# Patient Record
Sex: Female | Born: 1940 | Race: Black or African American | Hispanic: No | Smoking: Never smoker
Health system: Southern US, Community
[De-identification: ages and names within clinical notes are randomized; demographics above are authoritative.]

## PROBLEM LIST (undated history)

## (undated) DIAGNOSIS — I10 Essential (primary) hypertension: Secondary | ICD-10-CM

## (undated) DIAGNOSIS — J302 Other seasonal allergic rhinitis: Secondary | ICD-10-CM

---

## 2015-07-05 ENCOUNTER — Emergency Department (HOSPITAL_COMMUNITY): Payer: Medicare Other

## 2015-07-05 ENCOUNTER — Encounter (HOSPITAL_COMMUNITY): Payer: Self-pay | Admitting: Emergency Medicine

## 2015-07-05 ENCOUNTER — Observation Stay (HOSPITAL_COMMUNITY)
Admission: EM | Admit: 2015-07-05 | Discharge: 2015-07-06 | Disposition: A | Discharge status: Home / Self Care | Payer: Medicare Other | Attending: Internal Medicine | Admitting: Internal Medicine

## 2015-07-05 ENCOUNTER — Observation Stay (HOSPITAL_COMMUNITY): Payer: Medicare Other

## 2015-07-05 DIAGNOSIS — I4510 Unspecified right bundle-branch block: Secondary | ICD-10-CM | POA: Insufficient documentation

## 2015-07-05 DIAGNOSIS — E871 Hypo-osmolality and hyponatremia: Secondary | ICD-10-CM | POA: Insufficient documentation

## 2015-07-05 DIAGNOSIS — I10 Essential (primary) hypertension: Secondary | ICD-10-CM | POA: Diagnosis not present

## 2015-07-05 DIAGNOSIS — R4781 Slurred speech: Secondary | ICD-10-CM | POA: Insufficient documentation

## 2015-07-05 DIAGNOSIS — E861 Hypovolemia: Secondary | ICD-10-CM | POA: Diagnosis not present

## 2015-07-05 DIAGNOSIS — K5900 Constipation, unspecified: Secondary | ICD-10-CM | POA: Insufficient documentation

## 2015-07-05 DIAGNOSIS — E876 Hypokalemia: Secondary | ICD-10-CM | POA: Insufficient documentation

## 2015-07-05 DIAGNOSIS — R55 Syncope and collapse: Principal | ICD-10-CM | POA: Diagnosis present

## 2015-07-05 DIAGNOSIS — E669 Obesity, unspecified: Secondary | ICD-10-CM | POA: Diagnosis not present

## 2015-07-05 DIAGNOSIS — R479 Unspecified speech disturbances: Secondary | ICD-10-CM

## 2015-07-05 DIAGNOSIS — M1990 Unspecified osteoarthritis, unspecified site: Secondary | ICD-10-CM | POA: Diagnosis not present

## 2015-07-05 DIAGNOSIS — Z6841 Body Mass Index (BMI) 40.0 and over, adult: Secondary | ICD-10-CM | POA: Diagnosis not present

## 2015-07-05 HISTORY — DX: Essential (primary) hypertension: I10

## 2015-07-05 HISTORY — DX: Other seasonal allergic rhinitis: J30.2

## 2015-07-05 LAB — CBC
HEMATOCRIT: 43.4 % (ref 36.0–46.0)
HEMOGLOBIN: 14.7 g/dL (ref 12.0–15.0)
MCH: 28.1 pg (ref 26.0–34.0)
MCHC: 33.9 g/dL (ref 30.0–36.0)
MCV: 83 fL (ref 78.0–100.0)
Platelets: 257 10*3/uL (ref 150–400)
RBC: 5.23 MIL/uL — ABNORMAL HIGH (ref 3.87–5.11)
RDW: 13.8 % (ref 11.5–15.5)
WBC: 6.9 10*3/uL (ref 4.0–10.5)

## 2015-07-05 LAB — BASIC METABOLIC PANEL
ANION GAP: 13 (ref 5–15)
Anion gap: 9 (ref 5–15)
BUN: 11 mg/dL (ref 6–20)
BUN: 9 mg/dL (ref 6–20)
CALCIUM: 8.7 mg/dL — AB (ref 8.9–10.3)
CHLORIDE: 94 mmol/L — AB (ref 101–111)
CHLORIDE: 97 mmol/L — AB (ref 101–111)
CO2: 20 mmol/L — AB (ref 22–32)
CO2: 25 mmol/L (ref 22–32)
CREATININE: 0.77 mg/dL (ref 0.44–1.00)
Calcium: 9.1 mg/dL (ref 8.9–10.3)
Creatinine, Ser: 0.92 mg/dL (ref 0.44–1.00)
GFR calc Af Amer: >60 mL/min (ref >60)
GFR calc Af Amer: >60 mL/min (ref >60)
GFR calc non Af Amer: >60 mL/min (ref >60)
GFR, EST NON AFRICAN AMERICAN: 59 mL/min — AB (ref >60)
GLUCOSE: 101 mg/dL — AB (ref 65–99)
GLUCOSE: 106 mg/dL — AB (ref 65–99)
POTASSIUM: 3.8 mmol/L (ref 3.5–5.1)
Potassium: 3 mmol/L — ABNORMAL LOW (ref 3.5–5.1)
Sodium: 127 mmol/L — ABNORMAL LOW (ref 135–145)
Sodium: 131 mmol/L — ABNORMAL LOW (ref 135–145)

## 2015-07-05 LAB — URINALYSIS, ROUTINE W REFLEX MICROSCOPIC
Bilirubin Urine: NEGATIVE
GLUCOSE, UA: NEGATIVE mg/dL
HGB URINE DIPSTICK: NEGATIVE
Ketones, ur: 15 mg/dL — AB
LEUKOCYTES UA: NEGATIVE
Nitrite: NEGATIVE
PH: 7 (ref 5.0–8.0)
PROTEIN: NEGATIVE mg/dL
SPECIFIC GRAVITY, URINE: 1.007 (ref 1.005–1.030)

## 2015-07-05 LAB — HEPATIC FUNCTION PANEL
ALK PHOS: 55 U/L (ref 38–126)
ALT: 23 U/L (ref 14–54)
AST: 13 U/L — AB (ref 15–41)
Albumin: 3.3 g/dL — ABNORMAL LOW (ref 3.5–5.0)
BILIRUBIN DIRECT: 0.1 mg/dL (ref 0.1–0.5)
Indirect Bilirubin: 0.5 mg/dL (ref 0.3–0.9)
Total Bilirubin: 0.6 mg/dL (ref 0.3–1.2)
Total Protein: 7.1 g/dL (ref 6.5–8.1)

## 2015-07-05 LAB — I-STAT TROPONIN, ED: Troponin i, poc: 0 ng/mL (ref 0.00–0.08)

## 2015-07-05 LAB — TROPONIN I: Troponin I: <0.03 ng/mL (ref <0.03)

## 2015-07-05 MED ORDER — ENOXAPARIN SODIUM 40 MG/0.4ML ~~LOC~~ SOLN
40.0000 mg | SUBCUTANEOUS | Status: DC
  Administered 2015-07-05: 40 mg via SUBCUTANEOUS
  Filled 2015-07-05: qty 0.4

## 2015-07-05 MED ORDER — SODIUM CHLORIDE 0.9% FLUSH
3.0000 mL | Freq: Two times a day (BID) | INTRAVENOUS | Status: DC
  Administered 2015-07-05: 3 mL via INTRAVENOUS

## 2015-07-05 MED ORDER — DOCUSATE SODIUM 100 MG PO CAPS
100.0000 mg | ORAL_CAPSULE | Freq: Two times a day (BID) | ORAL | Status: DC
  Filled 2015-07-05 (×2): qty 1

## 2015-07-05 MED ORDER — SODIUM CHLORIDE 0.9 % IV SOLN
INTRAVENOUS | Status: DC
  Administered 2015-07-05: 23:00:00 via INTRAVENOUS

## 2015-07-05 MED ORDER — POTASSIUM CHLORIDE CRYS ER 20 MEQ PO TBCR
40.0000 meq | EXTENDED_RELEASE_TABLET | ORAL | Status: AC
  Administered 2015-07-05 – 2015-07-06 (×2): 40 meq via ORAL
  Filled 2015-07-05 (×2): qty 2

## 2015-07-05 MED ORDER — SODIUM CHLORIDE 0.9 % IV BOLUS (SEPSIS)
1000.0000 mL | Freq: Once | INTRAVENOUS | Status: AC
  Administered 2015-07-05: 1000 mL via INTRAVENOUS

## 2015-07-05 MED ORDER — ACETAMINOPHEN 325 MG PO TABS: 650.0000 mg | ORAL_TABLET | Freq: Four times a day (QID) | ORAL | Status: DC | PRN

## 2015-07-05 MED ORDER — ACETAMINOPHEN 650 MG RE SUPP: 650.0000 mg | Freq: Four times a day (QID) | RECTAL | Status: DC | PRN

## 2015-07-05 NOTE — ED Notes (Signed)
Admitting doctor in room.  Will transport pt to floor when he is finished.

## 2015-07-05 NOTE — ED Provider Notes (Signed)
CSN: [id number]     Arrival date & time 07/05/15  1243 History   First MD Initiated Contact with Patient 07/05/15 1310     Chief Complaint  Patient presents with  . Weakness  . Nausea     (Consider location/radiation/quality/duration/timing/severity/associated sxs/prior Treatment) HPI Pt presenting with c/o near syncope.  She was at neurosurgery office to be seen about neck pain.  She began to have nausea, then felt she might faint.  Family states she was not responsive but not totally unconscious.  No chest pain.  No focal weakness.  No facial droop.  No dysarthria- just sluggish to respond to their questions.  No palpitations.  No severe headache.  Pt feels back to her normal self at this time.  She denies having any symptoms over the past couple of days or this morning prior to this episode with the exception of neck pain.   No fever no dysuria.  There are no other associated systemic symptoms, there are no other alleviating or modifying factors.  History reviewed. No pertinent past medical history. History reviewed. No pertinent past surgical history. No family history on file. Social History  Substance Use Topics  . Smoking status: Never Smoker   . Smokeless tobacco: None  . Alcohol Use: No   OB History    No data available     Review of Systems  ROS reviewed and all otherwise negative except for mentioned in HPI    Allergies  Accuretic; Biaxin; Vibramycin; and Codeine  Home Medications   Prior to Admission medications   Not on File   BP 105/51 mmHg  Pulse 69  Temp(Src) 98 F (36.7 C) (Oral)  Resp 16  SpO2 99%  Vitals reviewed Physical Exam  Physical Examination: General appearance - alert, well appearing, and in no distress Mental status - alert, oriented to person, place, and time Eyes - pupils equal and reactive, extraocular eye movements intact Mouth - mucous membranes moist, pharynx normal without lesions Chest - clear to auscultation, no wheezes, rales or  rhonchi, symmetric air entry Heart - normal rate, regular rhythm, normal S1, S2, no murmurs, rubs, clicks or gallops Abdomen - soft, nontender, nondistended, no masses or organomegaly Neurological - alert, oriented x 3, cranial nerves 2-12 tested and intact, strength 5/5 in extremities x 4, sensation intact Extremities - peripheral pulses normal, no pedal edema, no clubbing or cyanosis Skin - normal coloration and turgor, no rashes  ED Course  Procedures (including critical care time) Labs Review Labs Reviewed  CBC - Abnormal; Notable for the following:    RBC 5.23 (*)    All other components within normal limits  URINALYSIS, ROUTINE W REFLEX MICROSCOPIC (NOT AT [facility]) - Abnormal; Notable for the following:    Ketones, ur 15 (*)    All other components within normal limits  BASIC METABOLIC PANEL - Abnormal; Notable for the following:    Sodium 127 (*)    Chloride 94 (*)    CO2 20 (*)    Glucose, Bld 101 (*)    GFR calc non Af Amer 59 (*)    All other components within normal limits  I-STAT TROPOININ, ED    Imaging Review Ct Head Wo Contrast  07/05/2015  CLINICAL DATA:  Slurred speech.  Weakness. EXAM: CT HEAD WITHOUT CONTRAST TECHNIQUE: Contiguous axial images were obtained from the base of the skull through the vertex without intravenous contrast. COMPARISON:  None. FINDINGS: Bony calvarium appears intact. No mass effect or midline shift is  noted. Ventricular size is within normal limits. There is no evidence of mass lesion, hemorrhage or acute infarction. IMPRESSION: Normal head CT. Electronically Signed   By: Lupita RaiderJames  Green Jr, M.D.   On: 07/05/2015 14:50   I have personally reviewed and evaluated these images and lab results as part of my medical decision-making.   EKG Interpretation   Date/Time:  Friday July 05 2015 12:48:22 EDT Ventricular Rate:  68 PR Interval:    QRS Duration: 154 QT Interval:  442 QTC Calculation: 471 R Axis:   34 Text Interpretation:  Sinus rhythm  LAE, consider biatrial enlargement  Right bundle branch block No significant change since last tracing earlier  today Confirmed by [facility]  MD, MARTHA [phone]) on 07/05/2015 2:11:41 PM      MDM   Final diagnoses:  Near syncope  Right bundle branch block  Hyponatremia    Pt presenting after becoming nauseated and having near syncopal event.  Labs are reassuring with some mild hyponatremia.  EKG shows right bundle branch block not know to be old.  Pt will be admitted to medical service for further workup.  Head CT reassuring- doubt CVA/TIA.    4:01 PM  D/w internal medicine teaching service.  They will come see the patient for admission.  [place] PMD in [place], so is unassigned in the [facility].    Jerelyn ScottMartha Linker, MD 07/06/15 [phone]

## 2015-07-05 NOTE — H&P (Signed)
Date: 07/05/2015               Patient Name:  Autumn Brady MRN: [id number]  DOB: Jun 05, 1940 Age / Sex: 75 y.o., female   PCP: No primary care provider on file.         Medical Service: Internal Medicine Teaching Service         Attending Physician: Dr. Doneen PoissonLawrence Klima, MD    First Contact: Dr. Earlene PlaterWallace Pager: [phone]  Second Contact: Dr. Isabella BowensKrall Pager: [phone]       After Hours (After 5p/  First Contact Pager: [phone]  weekends / holidays): Second Contact Pager: [phone]   Chief Complaint: Pre-syncope  History of Present Illness: Autumn Brady is a 75 y.o. woman with past medical history of HTN, osteoporosis, and allergies who presents today from her doctors office after an episode of near syncope.  She was at her neurosurgeon (Dr. Bevely Palmeritty) for her neck pain when she began to feel nauseous and that she was going to pass out.  She was standing at the nurses desk when her symptoms began.  States she went to the bathroom because she thought she may vomit and had to urinate.  When she came back, her family noticed that her speech was slowed, she was not making eye contact, and she seemed off balance.  She reports a prior syncopal event history approximately 1 year ago related to taking an herbal weight loss supplement.  She reports having decreasing PO intake recently in an effort to lose weight, but has been drinking adequate amounts of fluid.   She did not eat anything today prior to her appointment and only had water when she took her blood pressure medication.  She reported no chest pain, shortness of breath, or actual loss of consciousness.  There was no urinary or bowel incontinence.    On review of systems except for as noted above, she reports constipation x 1 week, no abdominal pain, no dysuria, fever, chills, sick contacts.  Meds: Current Facility-Administered Medications  Medication Dose Route Frequency Provider Last Rate Last Dose  . 0.9 %  sodium chloride infusion   Intravenous  Continuous Lora PaulaJennifer T Krall, MD      . acetaminophen (TYLENOL) tablet 650 mg  650 mg Oral Q6H PRN Lora PaulaJennifer T Krall, MD       Or  . acetaminophen (TYLENOL) suppository 650 mg  650 mg Rectal Q6H PRN Lora PaulaJennifer T Krall, MD      . docusate sodium (COLACE) capsule 100 mg  100 mg Oral BID Lora PaulaJennifer T Krall, MD      . enoxaparin (LOVENOX) injection 40 mg  40 mg Subcutaneous Q24H Lora PaulaJennifer T Krall, MD   40 mg at 07/05/15 1824  . sodium chloride flush (NS) 0.9 % injection 3 mL  3 mL Intravenous Q12H Lora PaulaJennifer T Krall, MD        Allergies: Allergies as of 07/05/2015 - Review Complete 07/05/2015  Allergen Reaction Noted  . Accuretic [quinapril-hydrochlorothiazide]  07/05/2015  . Biaxin [clarithromycin]  07/05/2015  . Vibramycin [doxycycline calcium]  07/05/2015  . Codeine Anxiety 07/05/2015   Past Medical History  Diagnosis Date  . HTN (hypertension)   . Seasonal allergies     Family History: positive for TIA, HLD, HTN, DM, breast cancer  Social History: lives with mother, sister, and nephew.  Retired Engineer, siteschool teacher, never smoker, no EtOH or drugs  Review of Systems: A complete ROS was negative except as per HPI.   Physical Exam:  Blood pressure 127/53, pulse 71, temperature 97.7 F (36.5 C), temperature source Oral, resp. rate 20, SpO2 97 %. General: pleasant, African-American woman, resting in bed, no distress HEENT: PERRL, EOMI, no scleral icterus Cardiac: RRR, no rubs, murmurs or gallops Pulm: clear to auscultation bilaterally, moving normal volumes of air Abd: soft, obese, nontender, nondistended, BS present Ext: warm and well perfused, obese legs Neuro: alert and oriented X3, cranial nerves II-XII grossly intact, no focal deficits, strength 5/5 upper and lower extremities, normal sensation to light touch Psych: soft spoken, normal mood, and affect Skin: no obvious rashes, intact   EKG: sinus rhythm, right bundle branch block, no previous comparisons  CXR: none  Assessment & Plan  by Problem: Active Problems:   Near syncope   Pre-syncope  Pre-Syncope: patient presents with near syncopal event at doctor's office with prodrome of nausea and lightheadedness.  This is in the setting of decreased PO intake in an effort to lose weight and having taken her blood pressure medicine this morning.  In the ED, CBC was unremarkable, BMET shows a sodium of 127, potassium of 3.8, bicarb 20, and glucose of 101.  An I-stat troponin was negative.  EKG was non-ischemic with a right bundle branch block.  Her urinalysis was not suggestive of urinary tract infection.  CT of the head without contrast was negative.  Differential includes TIA, dehydration leading to near syncope, or vasovagal.  Doubt seizure activity or cardiogenic given prodrome of symptoms - trend troponin - EKG in the AM - MRI brain - Echo - NS IV fluids - dietitian consult - repeat BMET in the morning  Hyponatremia: 127 on admission.  Unsure if chronic vs acute with no recent BMET on file.  Given likely dehydration, would anticipate a hypovolemic hypotonic hyponatremia - NS IV fluids - repeat BMET now and in the morning  HTN: home meds are Bystolic, Losartan, and Amlodipine - hold home meds for now.  BP stable  Constipation: reports 1 week of constipation without abdominal discomfort - Colace 100mg  BID  DVT PPx: Lovenox  Diet: Regular  CODE: FULL  Dispo: Admit patient to Observation with expected length of stay less than 2 midnights.  Signed: Gwynn BurlyAndrew Bellamia Ferch, DO 07/05/2015, 7:16 PM  Pager: [phone]

## 2015-07-05 NOTE — ED Notes (Signed)
Hospitalist at the bedside 

## 2015-07-05 NOTE — ED Notes (Signed)
[facility] after weakness and slurred speech per family. Pt was at neuro appt for L neck pain, ongoing since 6/18. At appt, family states pt became nauseous, weak and had garbled speech. Per family, weakness was not one-sided, pt remained a&ox4. C/o L neck pain and nausea now, MAE's equally, speech is clear.

## 2015-07-06 DIAGNOSIS — R55 Syncope and collapse: Secondary | ICD-10-CM | POA: Diagnosis not present

## 2015-07-06 DIAGNOSIS — E861 Hypovolemia: Secondary | ICD-10-CM | POA: Diagnosis not present

## 2015-07-06 DIAGNOSIS — E876 Hypokalemia: Secondary | ICD-10-CM | POA: Insufficient documentation

## 2015-07-06 DIAGNOSIS — I4510 Unspecified right bundle-branch block: Secondary | ICD-10-CM | POA: Insufficient documentation

## 2015-07-06 DIAGNOSIS — E871 Hypo-osmolality and hyponatremia: Secondary | ICD-10-CM | POA: Insufficient documentation

## 2015-07-06 LAB — GLUCOSE, CAPILLARY: Glucose-Capillary: 128 mg/dL — ABNORMAL HIGH (ref 65–99)

## 2015-07-06 LAB — BASIC METABOLIC PANEL
Anion gap: 9 (ref 5–15)
BUN: 11 mg/dL (ref 6–20)
CALCIUM: 8.8 mg/dL — AB (ref 8.9–10.3)
CHLORIDE: 100 mmol/L — AB (ref 101–111)
CO2: 23 mmol/L (ref 22–32)
CREATININE: 0.73 mg/dL (ref 0.44–1.00)
GFR calc Af Amer: >60 mL/min (ref >60)
GFR calc non Af Amer: >60 mL/min (ref >60)
Glucose, Bld: 110 mg/dL — ABNORMAL HIGH (ref 65–99)
Potassium: 3.3 mmol/L — ABNORMAL LOW (ref 3.5–5.1)
Sodium: 132 mmol/L — ABNORMAL LOW (ref 135–145)

## 2015-07-06 LAB — TROPONIN I

## 2015-07-06 MED ORDER — DOCUSATE SODIUM 100 MG PO CAPS: 100.0000 mg | ORAL_CAPSULE | Freq: Two times a day (BID) | ORAL | Status: DC

## 2015-07-06 MED ORDER — POTASSIUM CHLORIDE CRYS ER 20 MEQ PO TBCR
40.0000 meq | EXTENDED_RELEASE_TABLET | Freq: Once | ORAL | Status: AC
  Administered 2015-07-06: 40 meq via ORAL
  Filled 2015-07-06: qty 2

## 2015-07-06 NOTE — Discharge Summary (Signed)
Name: Autumn MourningMary Brady MRN: [id number] DOB: Dec 14, 1940 75 y.o. PCP: Arlina RobesAlbert Carl Winfield, MD  Date of Admission: 07/05/2015 12:43 PM Date of Discharge: 07/06/2015 Attending Physician: Doneen PoissonLawrence Klima, MD  Discharge Diagnosis: 1. Pre-syncope, secondary to hypovolemia  Discharge Medications:   Medication List    STOP taking these medications        amLODipine 10 MG tablet  Commonly known as:  NORVASC     BYSTOLIC 10 MG tablet  Generic drug:  nebivolol     losartan-hydrochlorothiazide 100-25 MG tablet  Commonly known as:  HYZAAR     methocarbamol 750 MG tablet  Commonly known as:  ROBAXIN     Potassium 99 MG Tabs      TAKE these medications        ADVAIR DISKUS 250-50 MCG/DOSE Aepb  Generic drug:  Fluticasone-Salmeterol  1 puff 2 (two) times daily.     aspirin EC 81 MG tablet  Take 81 mg by mouth daily at 12 noon.     CALCIUM-D PO  Take 1 tablet by mouth 2 (two) times daily.     cyclobenzaprine 10 MG tablet  Commonly known as:  FLEXERIL  Take 10 mg by mouth every 8 (eight) hours as needed for muscle spasms.     docusate sodium 100 MG capsule  Commonly known as:  COLACE  Take 1 capsule (100 mg total) by mouth 2 (two) times daily.     montelukast 10 MG tablet  Commonly known as:  SINGULAIR  Take 10 mg by mouth every morning.     raloxifene 60 MG tablet  Commonly known as:  EVISTA  Take 60 mg by mouth every morning.        Disposition and follow-up:   Autumn Brady was [facility] St[facility].  At the hospital follow up visit please address:  1.  Pre-syncope      Hypertension       Right bundle branch block      Hyponatremia      Hypokalemia  2.  Labs / imaging needed at time of follow-up: BMP, ECG  3.  Pending labs/ test needing follow-up: None   Follow-up Appointments: Follow-up Information    Schedule an appointment as soon as possible for a visit with Arlina RobesWINFIELD,ALBERT CARL, MD.   Specialty:  Family Medicine     Why:  Hospital follow up in 1-2 weeks. We will call on your behalf on Wednesday. If you do not hear from your office by the end of the week, please call your clinic.      Hospital Course by problem list: Active Problems:   Near syncope   Pre-syncope   Hyponatremia   Right bundle branch block   Hypokalemia   1.  Pre-syncope, secondary to hypovolemia, due to poor oral intake/new diet in combination with continued use of antihypertensives, which we discontinued. Symptoms improved with IVF and holding antihypertensives. Myocardial infarction was ruled out with serial troponins. ECG revealed first degree AV block and right bundle branch block, unknown if new and unable to obtain historical comparison. Neurologic workup comprised of CT head and MRI brain showed no abnormalities. We encouraged her to increase oral intake and hydration and continue holding all of her antihypertensive medications until seen by PCP.  2. Hypertension, over-treated on admission, was taking amlodipine, losartan-HCTZ, and Bystolic daily, held and remained normo-to-hypotensive, discontinued at discharge. Her PCP could slowly restart these medications as appropriate. Given her ECG findings, Bystolic should be  restarted last, if ever.  3. Right bundle branch block on ECG, with first-degree AV block, unclear if new, requires comparison to old ECGs or serial future measurement to track changes.  4. Hyponatremia, low of 127 on admission, unknown if diet and/or HCTZ-related, resolved with IVF and antihypertensive discontinuation while inpatient   5. Hypokalemia, low of 3.0 during admission, may have been HCTZ-related, repleted with potassium chloride prior to discharge  Discharge Vitals:   BP 116/60 mmHg  Pulse 80  Temp(Src) 97.7 F (36.5 C) (Oral)  Resp 20  Ht 5' 6.5" (1.689 m)  Wt 131.09 kg (289 lb)  BMI 45.95 kg/m2  SpO2 100%  Pertinent Labs, Studies, and Procedures:   BMP Latest Ref Rng 07/06/2015 07/05/2015  07/05/2015  Glucose 65 - 99 mg/dL 865(H110(H) 846(N106(H) 629(B101(H)  BUN 6 - 20 mg/dL 11 9 11   Creatinine 0.44 - 1.00 mg/dL 2.840.73 1.320.77 4.400.92  Sodium 135 - 145 mmol/L 132(L) 131(L) 127(L)  Potassium 3.5 - 5.1 mmol/L 3.3(L) 3.0(L) 3.8  Chloride 101 - 111 mmol/L 100(L) 97(L) 94(L)  CO2 22 - 32 mmol/L 23 25 20(L)  Calcium 8.9 - 10.3 mg/dL 1.0(U8.8(L) 7.2(Z8.7(L) 9.1   <ECG> 07/05/15 - Normal sinus rhythm at 69 bpm, normal axis, right bundle branch block, right atrial enlargement, no LVH by voltage, T-wave inversions in a strain pattern in III, aVF, and V1 through V3. No comparisons immediately available.  Discharge Instructions: Discharge Instructions    Call MD for:  difficulty breathing, headache or visual disturbances    Complete by:  As directed      Call MD for:  extreme fatigue    Complete by:  As directed      Call MD for:  persistant dizziness or light-headedness    Complete by:  As directed      Call MD for:  persistant nausea and vomiting    Complete by:  As directed      Call MD for:  severe uncontrolled pain    Complete by:  As directed      Diet - low sodium heart healthy    Complete by:  As directed      Discharge instructions    Complete by:  As directed   Stop taking Bystolic, Losartan-hydrochlorothiazide (Cozaar), and amlodipine. Follow up with your primary care physician to determine when to restart it.     Increase activity slowly    Complete by:  As directed            Signed: Althia FortsAdam Frayda Egley, MD 07/06/2015, 2:25 PM   Pager: [phone]

## 2015-07-06 NOTE — Progress Notes (Signed)
Initial Nutrition Assessment  DOCUMENTATION CODES:  Not applicable  INTERVENTION:  Extensive Education on safe weight loss with multiple handouts on appropriate diets and wt loss tips.   NUTRITION DIAGNOSIS:  Inadequate oral intake related to knowledge deficit, attempt to lose weight, poor appetite as evidenced by being dehydrated and per pt reporting of having minimal PTA the 24 hrs prior to syncopal episode  GOAL:  Patient will meet greater than or equal to 90% of their needs  MONITOR:  PO intake, Labs  REASON FOR ASSESSMENT:  Consult Poor PO  ASSESSMENT:  75 y/o woman with PMHx HTN, osteoporosis who presented after an episode of near syncope. She recently has been eating less in an effort to lose weight, but has been drinking adequate fluid. Suspected cause of syncope is taking BP med with only water and no intake. Admitted for work up.   Pt has been on weight watchers for ~ 1 month. She says she initially ate 2 meals a day. The first meal was an omlet and some [place] bacon/sausage. The 2nd meal varied but usually was a lean meat (chicken) and a salad. SHe snacked on fruit/veg as these are "free foods' under weight watcher standards. She drank only water, specifically four 16.9 oz bottles daily. She says she lost 19 lbs in 4 weeks on this diet.   However, she says that recently she started eating less and less due to a poor appetite. She says she was MADE to eat a bowl of cereal by her relative the day of the syncopal episode and had only grapes the night before. It sounds that the minimal intake was not intentional, rather she just didn't feel hungry.   RD educated that the reason she hasnt been hungry is likely because she has been in ketosis. In fact, her urine is positive for ketones.   She is upset with Weight watchers and the limited information they provided her.She reports most of the information is online and because she does not use the Internet, she is not privy to all the  nutritional information.  For example, She says she was never told how many servings of each food groups she needed to eat each day. She believes that this lack of knowledge is partially responsible for this illness.   RD spent time giving extensive education on safe weight loss. Discussed each food group and gave recommendations regarding how much of each one she should consume. Discussed fluid intake, portion control, mindless eating, food preporation as well. Gave examples of breakfast meals that would promote weight loss.   RD gave handouts with the requested information on daily reccommended serving amounts for each food group as well as sample menus and a list of guidelines for weight loss.   She has had success with weight watchers thus far and just needs to be more careful about overly restricting. She is already fairly knowledgeable when it comes to healthy eating habits.   Given lack of objective anthropometrics, weight history and limited report of PO intake, do not feel comfortable diagnosing with malnutrition or obesity .   Labs reviewed:   Recent Labs Lab 07/05/15 1342 07/05/15 1833 07/06/15 0214  NA 127* 131* 132*  K 3.8 3.0* 3.3*  CL 94* 97* 100*  CO2 20* 25 23  BUN 11 9 11   CREATININE 0.92 0.77 0.73  CALCIUM 9.1 8.7* 8.8*  GLUCOSE 101* 106* 110*   Diet Order:  Diet regular Room service appropriate?: Yes; Fluid consistency:: Thin  Skin:  Reviewed, no issues  Last BM:  6/30  Height:  Ht Readings from Last 1 Encounters:  07/06/15 5' 6.5" (1.689 m)  Pt reports 5' 6.5"   Weight:  Wt Readings from Last 1 Encounters:  07/06/15 289 lb (131.09 kg)  Pt reports 289 lbs Bed scale was not working  Ideal Body Weight:  60.22 kg  WUJ:WJXBBMI:Body mass index using pt reported information is 46 kg/(m^2).  Estimated Nutritional Needs:  Kcal:  1500-1700 kcals (for wt loss) Protein:  60-72 g (1-1.2 g/kg ibw) Fluid:  1.7 liters  EDUCATION NEEDS:  Education needs addressed    Autumn Brady RD, LDN, CNSC Clinical Nutrition Pager: [phone] 07/06/2015 1:14 PM

## 2015-07-06 NOTE — Progress Notes (Signed)
   Subjective: Autumn Brady received IVF overnight and is feeling much better this morning. She denies further episodes of dizziness or nausea, and slept pretty well overnight. She had some appetite this morning and ate breakfast. Autumn Brady expressed concern over her new weight watchers diet and finding an individualized regimen that will keep her from eating too little and feeling dizzy again.  Objective: Vital signs in last 24 hours: Filed Vitals:   07/06/15 0200 07/06/15 0430 07/06/15 0624 07/06/15 0957  BP: 108/48 107/53 119/46 113/72  Pulse: 78 81 76 82  Temp: 97.7 F (36.5 C) 98.1 F (36.7 C) 97.2 F (36.2 C) 98.2 F (36.8 C)  TempSrc: Oral Oral Oral Oral  Resp: 20 20 20    SpO2: 92% 94% 94% 93%   Physical Exam General appearance: Obese woman sitting on the side of the bed, conversational Cardiovascular: Audible S1, S2, no clear murmur, rubs, or gallop, limited by body habitus Respiratory: Clear to ascultation bilaterally, normal work of breathing Abdomen: Soft, obese, non-tender to palpation Skin: Intact, no visible rashes Neuro: Grossly intact Psych: Pleasant affect  Labs: BMP Latest Ref Rng 07/06/2015 07/05/2015 07/05/2015  Glucose 65 - 99 mg/dL 401(U110(H) 272(Z106(H) 366(Y101(H)  BUN 6 - 20 mg/dL 11 9 11   Creatinine 0.44 - 1.00 mg/dL 4.030.73 4.740.77 2.590.92  Sodium 135 - 145 mmol/L 132(L) 131(L) 127(L)  Potassium 3.5 - 5.1 mmol/L 3.3(L) 3.0(L) 3.8  Chloride 101 - 111 mmol/L 100(L) 97(L) 94(L)  CO2 22 - 32 mmol/L 23 25 20(L)  Calcium 8.9 - 10.3 mg/dL 5.6(L8.8(L) 8.7(F8.7(L) 9.1   CT Head w/o contrast - normal  MRI brain w/o contrast - no acute infarct, minimal chronic microvascular changes    Assessment/Plan: Autumn Brady is a 75 year old woman with PMH significant only for HTN, osteoporosis, and seasonal allergies who presented following an apparent presyncopal episode at her doctor's office on 07/05/15. Her symptoms were transient but concerning for potential cardiac or neurologic etiology and she was  admitted for IVF and workup. She was felt to be dehydrated on presentation and given 1L NS, also with mild hyponatremia to 127. EKG revealed RBB of unknown age, CT head and MRI brain were grossly normal.   Plan:  1. Pre-syncope secondary to hypovolemia, likely given skipped meal and low po intake prior to appointment, still took HTN meds, started new diet this week, improved with IVF  - Discontinue bp meds  - Encourage po intake and fluids  - Follow nutrition recs  - DC IVF, ensure remains asymptomatic prior to discharge  2. HTN, likely over-controlled, systolic pressures remaining in 100-110s with no bp meds  - DC home bp medications and follow-up with PCP  3. Hyponatremia, largely resolved with IVF  - Encourage po intake and fluids  Dispo: Anticipated discharge today, follow up with PCP Dr. Merleen MillinerWinfield.    Autumn FortsAdam Kena Limon, MD 07/06/2015, 10:04 AM Pager: [phone]

## 2015-07-06 NOTE — H&P (Signed)
Internal Medicine Attending Admission Note Date: 07/06/2015  Patient name: Autumn Brady Medical record number: [id number] Date of birth: Mar 30, 1940 Age: 75 y.o. Gender: female  I saw and evaluated the patient. I reviewed the resident's note and I agree with the resident's findings and plan as documented in the resident's note.  Chief Complaint(s): Near syncope  History - key components related to admission:  Autumn Brady is a 75 year old woman with a history of obesity, osteoporosis, allergic rhinitis, and hypertension who is transported to the emergency department today after an episode of near syncope at her neurosurgeon's office. She has just started a Weight Watchers diet and has had decreased oral intake over the last several days. When she presented to her neurosurgeon's office for further evaluation of some neck pain, while standing at the nurse's desk, she developed nausea and a sensation that she might pass out. Her family noticed that her responses to their questions were slowed. She reportedly had 2 previous episodes of syncope approximately one year ago at which time she reports an evaluation by a cardiologist. Apparently no etiology was found. We do not have access to those records at this time and she is unable to provide us with more detailed information.  She was admitted to the internal medicine teaching service for further evaluation and care. With IV hydration and repletion of her potassium she felt better this morning and denied any dizziness, palpitations, or near syncope.  Physical Exam - key components related to admission:  Filed Vitals:   07/06/15 0200 07/06/15 0430 07/06/15 0624 07/06/15 0957  BP: 108/48 107/53 119/46 113/72  Pulse: 78 81 76 82  Temp: 97.7 F (36.5 C) 98.1 F (36.7 C) 97.2 F (36.2 C) 98.2 F (36.8 C)  TempSrc: Oral Oral Oral Oral  Resp: 20 20 20    SpO2: 92% 94% 94% 93%   Gen.: Well-developed, well-nourished, woman sitting comfortably in bed eating  breakfast in no acute distress. Lungs: Clear to auscultation bilaterally without wheezes, rhonchi, or rales. Heart: Regular rate and rhythm without murmurs, rubs, or gallops.  Lab results:  Basic Metabolic Panel:  Recent Labs  62/13/0806/30/17 1833 07/06/15 0214  NA 131* 132*  K 3.0* 3.3*  CL 97* 100*  CO2 25 23  GLUCOSE 106* 110*  BUN 9 11  CREATININE 0.77 0.73  CALCIUM 8.7* 8.8*   Liver Function Tests:  Recent Labs  07/05/15 1833  AST 13*  ALT 23  ALKPHOS 55  BILITOT 0.6  PROT 7.1  ALBUMIN 3.3*   CBC:  Recent Labs  07/05/15 1342  WBC 6.9  HGB 14.7  HCT 43.4  MCV 83.0  PLT 257   Cardiac Enzymes:  Recent Labs  07/05/15 1833 07/06/15 0214  TROPONINI <0.03 <0.03   CBG:  Recent Labs  07/06/15 0627  GLUCAP 128*   Urinalysis:  Only remarkable for a small amount of ketones.  Imaging results:  Ct Head Wo Contrast  07/05/2015  CLINICAL DATA:  Slurred speech.  Weakness. EXAM: CT HEAD WITHOUT CONTRAST TECHNIQUE: Contiguous axial images were obtained from the base of the skull through the vertex without intravenous contrast. COMPARISON:  None. FINDINGS: Bony calvarium appears intact. No mass effect or midline shift is noted. Ventricular size is within normal limits. There is no evidence of mass lesion, hemorrhage or acute infarction. IMPRESSION: Normal head CT. Electronically Signed   By: Lupita RaiderJames  Green Jr, M.D.   On: 07/05/2015 14:50   Mri Brain Without Contrast  07/05/2015  CLINICAL DATA:  75 year old hypertensive  female with near syncopal episode. Altered speech. Subsequent encounter. EXAM: MRI HEAD WITHOUT CONTRAST TECHNIQUE: Multiplanar, multiecho pulse sequences of the brain and surrounding structures were obtained without intravenous contrast. COMPARISON:  07/05/2015 head CT.  No comparison brain MR. FINDINGS: No acute infarct or intracranial hemorrhage. Minimal chronic microvascular changes. Mild global atrophy without hydrocephalus. No intracranial mass lesion  noted on this unenhanced exam. Major intracranial vascular structures are patent. Partially empty sella incidentally noted. Cervical medullary junction unremarkable. No acute orbital abnormality. IMPRESSION: No acute infarct. Minimal chronic microvascular changes. Electronically Signed   By: Lacy DuverneySteven  Olson M.D.   On: 07/05/2015 21:47   Other results:  EKG: Normal sinus rhythm at 69 bpm, normal axis, right bundle branch block, right atrial enlargement, no LVH by voltage, T-wave inversions in a strain pattern in III, aVF, and V1 through V3. No comparisons immediately available.  Assessment & Plan by Problem:  Autumn Brady is a 75 year old woman with a history of obesity, osteoporosis, allergic rhinitis, and hypertension who is transported to the emergency department today after an episode of near syncope at her neurosurgeon's office. She has just started a Weight Watchers diet and has had decreased oral intake over the last several days she has continued to take her antihypertensive medications and her blood pressure was found to be in the low end of the normal range. Despite being ordered, orthostatic blood pressure and pulse was not obtained prior to her receiving IV fluids. She feels improved today. At this point our working diagnosis is hypovolemia from poor oral intake related to her new diet exacerbated by continued use of other antihypertensive medications.  Plan  1) Presyncope: She has ruled out for a myocardial infarction with serial troponins. Telemetry has not revealed any new findings above the first degree AV block and right bundle branch block seen on ECG. Given the slowness to respond to questions a brief neurologic workup was performed and demonstrated in unremarkable CT scan of the head and minimal microvascular changes in the brain per MRI. She feels symptomatically improved with IV hydration and holding of her blood pressure medications. We will encourage her to improve her oral intake  including hydration and hold all 3 of her antihypertensive medications until she is seen by her primary care provider. We will try to expedite the follow-up appointment with her primary care provider by calling on July 5 (the office is reportedly closed through the July 4 holiday). At that visit her primary care provider could slowly restart the antihypertensive medications if appropriate. Given her borderline first-degree AV block and right bundle branch block the last medication I would restart would be the bystolic.  2) Hypokalemia: We will provide her with oral replacement of potassium chloride 40 mEq once prior to discharge home. This too can be followed up in her primary care provider's office.  3) Disposition: She is stable for discharge home today with follow-up in her primary care provider's office as well as her cardiologist's office.

## 2015-07-06 NOTE — Progress Notes (Signed)
Discharge orders received.  Discharge instructions and follow-up appointments reviewed with the patient.  VSS upon discharge.  IV removed and education complete.  Transported out via wheelchair.   Derryl Uher M, RN 

## 2018-09-22 DIAGNOSIS — J189 Pneumonia, unspecified organism: Secondary | ICD-10-CM

## 2018-09-22 DIAGNOSIS — J9621 Acute and chronic respiratory failure with hypoxia: Secondary | ICD-10-CM

## 2018-09-22 DIAGNOSIS — J84114 Acute interstitial pneumonitis: Secondary | ICD-10-CM

## 2018-09-22 DIAGNOSIS — I5021 Acute systolic (congestive) heart failure: Secondary | ICD-10-CM

## 2018-09-23 DIAGNOSIS — I5021 Acute systolic (congestive) heart failure: Secondary | ICD-10-CM

## 2018-09-23 DIAGNOSIS — J189 Pneumonia, unspecified organism: Secondary | ICD-10-CM | POA: Diagnosis not present

## 2018-09-23 DIAGNOSIS — J84114 Acute interstitial pneumonitis: Secondary | ICD-10-CM

## 2018-09-23 DIAGNOSIS — J9621 Acute and chronic respiratory failure with hypoxia: Secondary | ICD-10-CM

## 2018-09-24 DIAGNOSIS — I5021 Acute systolic (congestive) heart failure: Secondary | ICD-10-CM

## 2018-09-24 DIAGNOSIS — J9621 Acute and chronic respiratory failure with hypoxia: Secondary | ICD-10-CM | POA: Diagnosis not present

## 2018-09-24 DIAGNOSIS — J84114 Acute interstitial pneumonitis: Secondary | ICD-10-CM

## 2018-09-24 DIAGNOSIS — J189 Pneumonia, unspecified organism: Secondary | ICD-10-CM

## 2018-09-25 DIAGNOSIS — J84114 Acute interstitial pneumonitis: Secondary | ICD-10-CM

## 2018-09-25 DIAGNOSIS — I5021 Acute systolic (congestive) heart failure: Secondary | ICD-10-CM

## 2018-09-25 DIAGNOSIS — J9621 Acute and chronic respiratory failure with hypoxia: Secondary | ICD-10-CM

## 2018-09-25 DIAGNOSIS — J189 Pneumonia, unspecified organism: Secondary | ICD-10-CM | POA: Diagnosis not present

## 2018-09-30 ENCOUNTER — Inpatient Hospital Stay (HOSPITAL_COMMUNITY)
Admission: AD | Admit: 2018-09-30 | Discharge: 2018-10-06 | DRG: 291 | Disposition: A | Discharge status: Other Acute Inpatient Hospital | Payer: Medicare Other | Source: Other Acute Inpatient Hospital | Attending: Pulmonary Disease | Admitting: Pulmonary Disease

## 2018-09-30 ENCOUNTER — Inpatient Hospital Stay (HOSPITAL_COMMUNITY): Payer: Medicare Other

## 2018-09-30 DIAGNOSIS — I2781 Cor pulmonale (chronic): Secondary | ICD-10-CM | POA: Diagnosis present

## 2018-09-30 DIAGNOSIS — Y95 Nosocomial condition: Secondary | ICD-10-CM | POA: Diagnosis present

## 2018-09-30 DIAGNOSIS — Z20828 Contact with and (suspected) exposure to other viral communicable diseases: Secondary | ICD-10-CM | POA: Diagnosis present

## 2018-09-30 DIAGNOSIS — E869 Volume depletion, unspecified: Secondary | ICD-10-CM | POA: Diagnosis not present

## 2018-09-30 DIAGNOSIS — Z885 Allergy status to narcotic agent status: Secondary | ICD-10-CM

## 2018-09-30 DIAGNOSIS — R609 Edema, unspecified: Secondary | ICD-10-CM | POA: Diagnosis not present

## 2018-09-30 DIAGNOSIS — R52 Pain, unspecified: Secondary | ICD-10-CM | POA: Diagnosis not present

## 2018-09-30 DIAGNOSIS — R0902 Hypoxemia: Secondary | ICD-10-CM | POA: Diagnosis not present

## 2018-09-30 DIAGNOSIS — I110 Hypertensive heart disease with heart failure: Principal | ICD-10-CM | POA: Diagnosis present

## 2018-09-30 DIAGNOSIS — N179 Acute kidney failure, unspecified: Secondary | ICD-10-CM

## 2018-09-30 DIAGNOSIS — Z884 Allergy status to anesthetic agent status: Secondary | ICD-10-CM

## 2018-09-30 DIAGNOSIS — Z6841 Body Mass Index (BMI) 40.0 and over, adult: Secondary | ICD-10-CM

## 2018-09-30 DIAGNOSIS — E861 Hypovolemia: Secondary | ICD-10-CM | POA: Diagnosis not present

## 2018-09-30 DIAGNOSIS — J9602 Acute respiratory failure with hypercapnia: Secondary | ICD-10-CM | POA: Diagnosis not present

## 2018-09-30 DIAGNOSIS — Z7982 Long term (current) use of aspirin: Secondary | ICD-10-CM | POA: Diagnosis not present

## 2018-09-30 DIAGNOSIS — I2729 Other secondary pulmonary hypertension: Secondary | ICD-10-CM | POA: Diagnosis present

## 2018-09-30 DIAGNOSIS — Z881 Allergy status to other antibiotic agents status: Secondary | ICD-10-CM | POA: Diagnosis not present

## 2018-09-30 DIAGNOSIS — J189 Pneumonia, unspecified organism: Secondary | ICD-10-CM | POA: Diagnosis present

## 2018-09-30 DIAGNOSIS — I5030 Unspecified diastolic (congestive) heart failure: Secondary | ICD-10-CM | POA: Diagnosis present

## 2018-09-30 DIAGNOSIS — I5031 Acute diastolic (congestive) heart failure: Secondary | ICD-10-CM | POA: Diagnosis not present

## 2018-09-30 DIAGNOSIS — I4510 Unspecified right bundle-branch block: Secondary | ICD-10-CM | POA: Diagnosis present

## 2018-09-30 DIAGNOSIS — I5082 Biventricular heart failure: Secondary | ICD-10-CM | POA: Diagnosis present

## 2018-09-30 DIAGNOSIS — M1990 Unspecified osteoarthritis, unspecified site: Secondary | ICD-10-CM | POA: Diagnosis present

## 2018-09-30 DIAGNOSIS — J9600 Acute respiratory failure, unspecified whether with hypoxia or hypercapnia: Secondary | ICD-10-CM

## 2018-09-30 DIAGNOSIS — Z91040 Latex allergy status: Secondary | ICD-10-CM

## 2018-09-30 DIAGNOSIS — E876 Hypokalemia: Secondary | ICD-10-CM | POA: Diagnosis not present

## 2018-09-30 DIAGNOSIS — Z9981 Dependence on supplemental oxygen: Secondary | ICD-10-CM

## 2018-09-30 DIAGNOSIS — R0602 Shortness of breath: Secondary | ICD-10-CM | POA: Diagnosis not present

## 2018-09-30 DIAGNOSIS — I5033 Acute on chronic diastolic (congestive) heart failure: Secondary | ICD-10-CM | POA: Diagnosis present

## 2018-09-30 DIAGNOSIS — E871 Hypo-osmolality and hyponatremia: Secondary | ICD-10-CM | POA: Diagnosis present

## 2018-09-30 DIAGNOSIS — I361 Nonrheumatic tricuspid (valve) insufficiency: Secondary | ICD-10-CM | POA: Diagnosis not present

## 2018-09-30 DIAGNOSIS — I272 Pulmonary hypertension, unspecified: Secondary | ICD-10-CM | POA: Diagnosis not present

## 2018-09-30 DIAGNOSIS — I959 Hypotension, unspecified: Secondary | ICD-10-CM | POA: Diagnosis present

## 2018-09-30 DIAGNOSIS — I9589 Other hypotension: Secondary | ICD-10-CM | POA: Diagnosis not present

## 2018-09-30 DIAGNOSIS — R34 Anuria and oliguria: Secondary | ICD-10-CM | POA: Diagnosis not present

## 2018-09-30 DIAGNOSIS — L8990 Pressure ulcer of unspecified site, unspecified stage: Secondary | ICD-10-CM | POA: Insufficient documentation

## 2018-09-30 DIAGNOSIS — Z23 Encounter for immunization: Secondary | ICD-10-CM | POA: Diagnosis present

## 2018-09-30 DIAGNOSIS — T502X5A Adverse effect of carbonic-anhydrase inhibitors, benzothiadiazides and other diuretics, initial encounter: Secondary | ICD-10-CM | POA: Diagnosis present

## 2018-09-30 DIAGNOSIS — E662 Morbid (severe) obesity with alveolar hypoventilation: Secondary | ICD-10-CM | POA: Diagnosis present

## 2018-09-30 DIAGNOSIS — J9621 Acute and chronic respiratory failure with hypoxia: Secondary | ICD-10-CM | POA: Diagnosis present

## 2018-09-30 DIAGNOSIS — J811 Chronic pulmonary edema: Secondary | ICD-10-CM

## 2018-09-30 DIAGNOSIS — Z452 Encounter for adjustment and management of vascular access device: Secondary | ICD-10-CM

## 2018-09-30 DIAGNOSIS — J9601 Acute respiratory failure with hypoxia: Secondary | ICD-10-CM | POA: Diagnosis not present

## 2018-09-30 DIAGNOSIS — J9622 Acute and chronic respiratory failure with hypercapnia: Secondary | ICD-10-CM | POA: Diagnosis present

## 2018-09-30 DIAGNOSIS — R570 Cardiogenic shock: Secondary | ICD-10-CM | POA: Diagnosis present

## 2018-09-30 DIAGNOSIS — I2721 Secondary pulmonary arterial hypertension: Secondary | ICD-10-CM | POA: Diagnosis not present

## 2018-09-30 MED ORDER — HEPARIN SODIUM (PORCINE) 5000 UNIT/ML IJ SOLN
5000.0000 [IU] | Freq: Three times a day (TID) | INTRAMUSCULAR | Status: DC
  Administered 2018-10-01: 01:00:00 5000 [IU] via SUBCUTANEOUS
  Filled 2018-09-30: qty 1

## 2018-09-30 NOTE — H&P (Addendum)
NAME:  Autumn MourningMary Naeem, MRN:  [id number], DOB:  September 27, 1940, LOS: 0 ADMISSION DATE:  09/30/2018, CONSULTATION DATE:  09/30/2018 REFERRING MD:  [facility], CHIEF COMPLAINT:  Hypoxic respiratory failure  Brief History   [age] yof transferred from [facility], originally admitted for hypoxic respiratory failure being treated there for HCAP and heart failure with progressive AKI with diuresis and hypoxia requiring [facility].   History of present illness   HPI obtained from medical chart review which has limited information, no care everywhere documents, and from patient.   78 year old female with prior history of HTN, syncope, bradycardia and possible systolic HF who originally [facility] to [facility] after labor day with abdominal pain and diarrhea.  Hospitalization was complicated by acute hypoxic respiratory failure and unable to weaned off HFNC transferred [facility]. She was treated with Levaquin for possible infectious etiology. Also being diuresed for heart failure with progressive AKI and hypoxia requiring NPPV and heated high flow. [facility] for higher level of care not available at [facility], PCCM to admit.  Additionally, with [facility] transport team, patient had complained of chest pain with EKG concerning for acute STEMI, therefore Cardiology evaluated patient on arrival to ICU, found not to be a code STEMI.   Past Medical History  Respiratory failure, HTN, syncope, bradycardia, ?HF  Significant Hospital Events   9/16 tx to [facility] from [facility] 9/25 Admit to [facility]  Consults:  Cardiology  Procedures:   Significant Diagnostic Tests:   Micro Data:  OSH COVID 9/17 >> neg  9/26 SARS CoV 2 9/26 BCx2 >> 9/26 UC >>  Antimicrobials:  OHS levaquin  Interim history/subjective:  Arrived by [facility] on NRB, Cardiology at bedside  Objective    NSR 83 BP 92/53 (MAP 65) RR 18 SpO2 100 on NRB   There were no vitals taken for  this visit.       No intake or output data in the 24 hours ending 09/30/18 2348 There were no vitals filed for this visit.  Examination: General:  Obese, older female sitting in bed in NAD on NRB HEENT: MM pink/moist, pupils 3/reactive Neuro: Alert, oriented x 3, MAE, generalized weakness CV: rr, no obv murmur, +1 pulses PULM:  No resp distress, lungs clear anteriorly, diminished in bases GI:  Soft NT/ ND, +bs Extremities: cool/dry, no LE edema, warmth or erythema  Skin: no rashes   Resolved Hospital Problem list    Assessment & Plan:   Acute hypoxic respiratory failure- unclear etiology, has been treated for CAP and diuresed for heart failure - CT chest w/o contrast but unclear findings, other than possible LLL PNA, no report or CD sent with patient - ddx to include PE vs infectious or inflammatory etiology.  Wells score 4.5, given reported hx of bradycardia and syncope as well has patient having refractory hypoxia despite diuresis and abx, P:  NPO At risk for intubation, although currently has no WOB, currently on NRB at 100% Wean O2 for goal sat >92% HFNC prn  CXR and ABG Checking BMP, ddimer, BNP and troponin.  Depending on sCr to determine if we can proceed with CTA chest  Will inquire if we can get OHS recorded of Chest CT   Hypotension - could be related to sepsis, acute HFrEF, vs obstructive if PE P:  ICU monitoring Goal MAP > 65 Could try small fluid bolus as she does not appear volume overloaded prior to starting peripheral pressors TTE  in am  Labs - checking CXR, lactate, CBC, CMP, coags,  PCT, UA and sending baseline cultures Hold on empiric abx for now   Prolonged QTc  ? Heart failure, unclear acute hx or chronic Hx HTN, bradycardia  - concern for STEMI, appreciate cardiology evaluation who did not feel EKG consistant for acute STE, noted to have RBBB and nonspecific ST changes, unclear previous EKG  P:  Appreciate Cardiology evaluation Avoid QTc  prolonging medications/ monitor QTc  Tele monitoring TTE Trend EKG, hs-trop Assess BNP Hold HTN meds given borderline MAP   AKI - unclear baseline sCr P:  Insert foley, strict I/Os Pending BMP, Mag, Phos, sending UA and lytes Renal US Consider Nephrology consult  Best practice:  Diet: NPO Pain/Anxiety/Delirium protocol (if indicated): n/a VAP protocol (if indicated): n/a DVT prophylaxis: heparin SQ GI prophylaxis: PPI Glucose control: CBG q 4, add SSI if > 180 Mobility: progress as able Code Status: full  Family Communication: pending Disposition: ICU   Labs   CBC: Recent Labs  Lab 09/30/18 2324  HGB 11.6*  HCT 34.0*    Basic Metabolic Panel: Recent Labs  Lab 09/30/18 2324  NA 123*  K 3.8   GFR: CrCl cannot be calculated (Patient's most recent lab result is older than the maximum 21 days allowed.). No results for input(s): PROCALCITON, WBC, LATICACIDVEN in the last 168 hours.  Liver Function Tests: No results for input(s): AST, ALT, ALKPHOS, BILITOT, PROT, ALBUMIN in the last 168 hours. No results for input(s): LIPASE, AMYLASE in the last 168 hours. No results for input(s): AMMONIA in the last 168 hours.  ABG    Component Value Date/Time   PHART 7.462 (H) 09/30/2018 2324   PCO2ART 55.6 (H) 09/30/2018 2324   PO2ART 46.0 (L) 09/30/2018 2324   HCO3 39.7 (H) 09/30/2018 2324   TCO2 41 (H) 09/30/2018 2324   O2SAT 83.0 09/30/2018 2324     Coagulation Profile: No results for input(s): INR, PROTIME in the last 168 hours.  Cardiac Enzymes: No results for input(s): CKTOTAL, CKMB, CKMBINDEX, TROPONINI in the last 168 hours.  HbA1C: No results found for: HGBA1C  CBG: No results for input(s): GLUCAP in the last 168 hours.  Review of Systems:   Patient without any current complaints.    Review of Systems  Constitutional: Negative for chills and fever.  Respiratory: Negative for cough, hemoptysis, shortness of breath and wheezing.   Cardiovascular:  Negative for chest pain, palpitations and leg swelling.  Gastrointestinal: Negative for abdominal pain, nausea and vomiting.  Skin: Negative for itching.  Neurological: Positive for weakness. Negative for focal weakness.   Past Medical History  She,  has a past medical history of HTN (hypertension) and Seasonal allergies.   Surgical History   No past surgical history on file.   Social History   reports that she has never smoked. She does not have any smokeless tobacco history on file. She reports that she does not drink alcohol.   Family History   Her family history is not on file.   Allergies Allergies  Allergen Reactions  . Accuretic [Quinapril-Hydrochlorothiazide] Other (See Comments)    passed out  . Codeine Anxiety    hyper  . Biaxin [Clarithromycin] Other (See Comments)    disoriented  . Adhesive [Tape] Other (See Comments)    Redness and tears skin, Please use "paper" tape  . Latex Other (See Comments)    Bruising  . Other Other (See Comments)    Anesthesia (pt not sure  which one) made her jump off of the table  . Vibramycin [Doxycycline Calcium] Nausea And Vomiting     Home Medications  Prior to Admission medications   Medication Sig Start Date End Date Taking? Authorizing Provider  ADVAIR DISKUS 250-50 MCG/DOSE AEPB 1 puff 2 (two) times daily. 06/14/15   [provider]  aspirin EC 81 MG tablet Take 81 mg by mouth daily at 12 noon.    [provider]  Calcium Carbonate-Vitamin D (CALCIUM-D PO) Take 1 tablet by mouth 2 (two) times daily.    [provider]  cyclobenzaprine (FLEXERIL) 10 MG tablet Take 10 mg by mouth every 8 (eight) hours as needed for muscle spasms.  06/26/15   [provider]  docusate sodium (COLACE) 100 MG capsule Take 1 capsule (100 mg total) by mouth 2 (two) times daily. 07/06/15   Milagros Loll, MD  montelukast (SINGULAIR) 10 MG tablet Take 10 mg by mouth every morning. 04/05/15   [provider]   raloxifene (EVISTA) 60 MG tablet Take 60 mg by mouth every morning. 06/14/15   [provider]     I spent 50 minutes in direct patient care including reviewing data,  discussing with other providers, assessment, planning and stabilization and documentation. Time is exclusive to this patient and does not include procedures.    Kennieth Rad, MSN, AGACNP-BC [facility] Pgr: [phone] or if no answer [phone] 10/01/2018, 12:11 AM

## 2018-10-01 ENCOUNTER — Inpatient Hospital Stay (HOSPITAL_COMMUNITY): Payer: Medicare Other

## 2018-10-01 DIAGNOSIS — I361 Nonrheumatic tricuspid (valve) insufficiency: Secondary | ICD-10-CM

## 2018-10-01 DIAGNOSIS — R0602 Shortness of breath: Secondary | ICD-10-CM

## 2018-10-01 DIAGNOSIS — L8990 Pressure ulcer of unspecified site, unspecified stage: Secondary | ICD-10-CM | POA: Insufficient documentation

## 2018-10-01 DIAGNOSIS — R609 Edema, unspecified: Secondary | ICD-10-CM

## 2018-10-01 DIAGNOSIS — R0902 Hypoxemia: Secondary | ICD-10-CM

## 2018-10-01 DIAGNOSIS — E861 Hypovolemia: Secondary | ICD-10-CM

## 2018-10-01 DIAGNOSIS — R52 Pain, unspecified: Secondary | ICD-10-CM

## 2018-10-01 DIAGNOSIS — N179 Acute kidney failure, unspecified: Secondary | ICD-10-CM

## 2018-10-01 DIAGNOSIS — J9600 Acute respiratory failure, unspecified whether with hypoxia or hypercapnia: Secondary | ICD-10-CM

## 2018-10-01 LAB — BASIC METABOLIC PANEL
Anion gap: 14 (ref 5–15)
Anion gap: 12 (ref 5–15)
Anion gap: 15 (ref 5–15)
BUN: 47 mg/dL — ABNORMAL HIGH (ref 8–23)
BUN: 43 mg/dL — ABNORMAL HIGH (ref 8–23)
BUN: 37 mg/dL — ABNORMAL HIGH (ref 8–23)
CO2: 31 mmol/L (ref 22–32)
CO2: 34 mmol/L — ABNORMAL HIGH (ref 22–32)
CO2: 30 mmol/L (ref 22–32)
Calcium: 7.9 mg/dL — ABNORMAL LOW (ref 8.9–10.3)
Calcium: 7.8 mg/dL — ABNORMAL LOW (ref 8.9–10.3)
Calcium: 7.8 mg/dL — ABNORMAL LOW (ref 8.9–10.3)
Chloride: 79 mmol/L — ABNORMAL LOW (ref 98–111)
Chloride: 79 mmol/L — ABNORMAL LOW (ref 98–111)
Chloride: 83 mmol/L — ABNORMAL LOW (ref 98–111)
Creatinine, Ser: 2.49 mg/dL — ABNORMAL HIGH (ref 0.44–1.00)
Creatinine, Ser: 1.97 mg/dL — ABNORMAL HIGH (ref 0.44–1.00)
Creatinine, Ser: 1.55 mg/dL — ABNORMAL HIGH (ref 0.44–1.00)
GFR calc Af Amer: 21 mL/min — ABNORMAL LOW (ref >60)
GFR calc Af Amer: 28 mL/min — ABNORMAL LOW (ref >60)
GFR calc Af Amer: 37 mL/min — ABNORMAL LOW (ref >60)
GFR calc non Af Amer: 18 mL/min — ABNORMAL LOW (ref >60)
GFR calc non Af Amer: 24 mL/min — ABNORMAL LOW (ref >60)
GFR calc non Af Amer: 32 mL/min — ABNORMAL LOW (ref >60)
Glucose, Bld: 109 mg/dL — ABNORMAL HIGH (ref 70–99)
Glucose, Bld: 110 mg/dL — ABNORMAL HIGH (ref 70–99)
Glucose, Bld: 113 mg/dL — ABNORMAL HIGH (ref 70–99)
Potassium: 4.1 mmol/L (ref 3.5–5.1)
Potassium: 3.7 mmol/L (ref 3.5–5.1)
Potassium: 3.6 mmol/L (ref 3.5–5.1)
Sodium: 124 mmol/L — ABNORMAL LOW (ref 135–145)
Sodium: 125 mmol/L — ABNORMAL LOW (ref 135–145)
Sodium: 128 mmol/L — ABNORMAL LOW (ref 135–145)

## 2018-10-01 LAB — CBC
HCT: 33.5 % — ABNORMAL LOW (ref 36.0–46.0)
Hemoglobin: 11.1 g/dL — ABNORMAL LOW (ref 12.0–15.0)
MCH: 28 pg (ref 26.0–34.0)
MCHC: 33.1 g/dL (ref 30.0–36.0)
MCV: 84.4 fL (ref 80.0–100.0)
Platelets: 394 10*3/uL (ref 150–400)
RBC: 3.97 MIL/uL (ref 3.87–5.11)
RDW: 14.4 % (ref 11.5–15.5)
WBC: 8.8 10*3/uL (ref 4.0–10.5)
nRBC: 0 % (ref 0.0–0.2)

## 2018-10-01 LAB — PROCALCITONIN: Procalcitonin: <0.1 ng/mL

## 2018-10-01 LAB — CREATININE, URINE, RANDOM: Creatinine, Urine: 150.01 mg/dL

## 2018-10-01 LAB — PHOSPHORUS: Phosphorus: 3 mg/dL (ref 2.5–4.6)

## 2018-10-01 LAB — SODIUM, URINE, RANDOM: Sodium, Ur: <10 mmol/L

## 2018-10-01 LAB — CBC WITH DIFFERENTIAL/PLATELET
Abs Immature Granulocytes: 0.7 10*3/uL — ABNORMAL HIGH (ref 0.00–0.07)
Basophils Absolute: 0.1 10*3/uL (ref 0.0–0.1)
Basophils Relative: 1 %
Eosinophils Absolute: 0.5 10*3/uL (ref 0.0–0.5)
Eosinophils Relative: 6 %
HCT: 35.8 % — ABNORMAL LOW (ref 36.0–46.0)
Hemoglobin: 11.7 g/dL — ABNORMAL LOW (ref 12.0–15.0)
Immature Granulocytes: 8 %
Lymphocytes Relative: 18 %
Lymphs Abs: 1.7 10*3/uL (ref 0.7–4.0)
MCH: 27.9 pg (ref 26.0–34.0)
MCHC: 32.7 g/dL (ref 30.0–36.0)
MCV: 85.4 fL (ref 80.0–100.0)
Monocytes Absolute: 1.3 10*3/uL — ABNORMAL HIGH (ref 0.1–1.0)
Monocytes Relative: 14 %
Neutro Abs: 5 10*3/uL (ref 1.7–7.7)
Neutrophils Relative %: 53 %
Platelets: 408 10*3/uL — ABNORMAL HIGH (ref 150–400)
RBC: 4.19 MIL/uL (ref 3.87–5.11)
RDW: 14.5 % (ref 11.5–15.5)
WBC: 9.3 10*3/uL (ref 4.0–10.5)
nRBC: 0 % (ref 0.0–0.2)

## 2018-10-01 LAB — COMPREHENSIVE METABOLIC PANEL
ALT: 19 U/L (ref 0–44)
AST: 21 U/L (ref 15–41)
Albumin: 2.3 g/dL — ABNORMAL LOW (ref 3.5–5.0)
Alkaline Phosphatase: 48 U/L (ref 38–126)
Anion gap: 10 (ref 5–15)
BUN: 46 mg/dL — ABNORMAL HIGH (ref 8–23)
CO2: 40 mmol/L — ABNORMAL HIGH (ref 22–32)
Calcium: 8.2 mg/dL — ABNORMAL LOW (ref 8.9–10.3)
Chloride: 76 mmol/L — ABNORMAL LOW (ref 98–111)
Creatinine, Ser: 2.67 mg/dL — ABNORMAL HIGH (ref 0.44–1.00)
GFR calc Af Amer: 19 mL/min — ABNORMAL LOW (ref >60)
GFR calc non Af Amer: 16 mL/min — ABNORMAL LOW (ref >60)
Glucose, Bld: 118 mg/dL — ABNORMAL HIGH (ref 70–99)
Potassium: 4 mmol/L (ref 3.5–5.1)
Sodium: 126 mmol/L — ABNORMAL LOW (ref 135–145)
Total Bilirubin: 0.6 mg/dL (ref 0.3–1.2)
Total Protein: 7.5 g/dL (ref 6.5–8.1)

## 2018-10-01 LAB — URINALYSIS, ROUTINE W REFLEX MICROSCOPIC
Bilirubin Urine: NEGATIVE
Glucose, UA: NEGATIVE mg/dL
Hgb urine dipstick: NEGATIVE
Ketones, ur: NEGATIVE mg/dL
Leukocytes,Ua: NEGATIVE
Nitrite: NEGATIVE
Protein, ur: NEGATIVE mg/dL
Specific Gravity, Urine: 1.015 (ref 1.005–1.030)
pH: 5 (ref 5.0–8.0)

## 2018-10-01 LAB — SARS CORONAVIRUS 2 BY RT PCR (HOSPITAL ORDER, PERFORMED IN ~~LOC~~ HOSPITAL LAB): SARS Coronavirus 2: NEGATIVE

## 2018-10-01 LAB — TSH: TSH: 2.159 u[IU]/mL (ref 0.350–4.500)

## 2018-10-01 LAB — BRAIN NATRIURETIC PEPTIDE: B Natriuretic Peptide: 974.3 pg/mL — ABNORMAL HIGH (ref 0.0–100.0)

## 2018-10-01 LAB — HEPARIN LEVEL (UNFRACTIONATED): Heparin Unfractionated: 0.53 IU/mL (ref 0.30–0.70)

## 2018-10-01 LAB — COOXEMETRY PANEL
Carboxyhemoglobin: 1.2 % (ref 0.5–1.5)
Methemoglobin: 1 % (ref 0.0–1.5)
O2 Saturation: 61.2 %
Total hemoglobin: 10.9 g/dL — ABNORMAL LOW (ref 12.0–16.0)

## 2018-10-01 LAB — LACTIC ACID, PLASMA: Lactic Acid, Venous: 1.6 mmol/L (ref 0.5–1.9)

## 2018-10-01 LAB — TRIGLYCERIDES: Triglycerides: 57 mg/dL (ref <150)

## 2018-10-01 LAB — OSMOLALITY, URINE: Osmolality, Ur: 375 mOsm/kg (ref 300–900)

## 2018-10-01 LAB — MRSA PCR SCREENING: MRSA by PCR: NEGATIVE

## 2018-10-01 LAB — APTT: aPTT: 30 seconds (ref 24–36)

## 2018-10-01 LAB — ECHOCARDIOGRAM COMPLETE: Weight: 4663.17 oz

## 2018-10-01 LAB — MAGNESIUM: Magnesium: 1.9 mg/dL (ref 1.7–2.4)

## 2018-10-01 LAB — D-DIMER, QUANTITATIVE: D-Dimer, Quant: 2.44 ug/mL-FEU — ABNORMAL HIGH (ref 0.00–0.50)

## 2018-10-01 LAB — TROPONIN I (HIGH SENSITIVITY)
Troponin I (High Sensitivity): 131 ng/L (ref <18)
Troponin I (High Sensitivity): 105 ng/L (ref <18)

## 2018-10-01 LAB — CORTISOL: Cortisol, Plasma: 9.8 ug/dL

## 2018-10-01 LAB — OSMOLALITY: Osmolality: 274 mOsm/kg — ABNORMAL LOW (ref 275–295)

## 2018-10-01 MED ORDER — NOREPINEPHRINE 4 MG/250ML-% IV SOLN
INTRAVENOUS | Status: AC
  Administered 2018-10-01: 04:00:00 2 ug/min via INTRAVENOUS
  Filled 2018-10-01: qty 250

## 2018-10-01 MED ORDER — SODIUM CHLORIDE 0.9% FLUSH
10.0000 mL | Freq: Two times a day (BID) | INTRAVENOUS | Status: DC
  Administered 2018-10-02 – 2018-10-05 (×9): 10 mL

## 2018-10-01 MED ORDER — PANTOPRAZOLE SODIUM 40 MG IV SOLR
40.0000 mg | INTRAVENOUS | Status: DC
  Administered 2018-10-01 – 2018-10-02 (×2): 40 mg via INTRAVENOUS
  Filled 2018-10-01 (×2): qty 40

## 2018-10-01 MED ORDER — SODIUM CHLORIDE 0.9 % IV BOLUS: 500.0000 mL | Freq: Once | INTRAVENOUS | Status: DC

## 2018-10-01 MED ORDER — FUROSEMIDE 10 MG/ML IJ SOLN
40.0000 mg | Freq: Once | INTRAMUSCULAR | Status: AC
  Administered 2018-10-01: 17:00:00 40 mg via INTRAVENOUS
  Filled 2018-10-01: qty 4

## 2018-10-01 MED ORDER — NOREPINEPHRINE 16 MG/250ML-% IV SOLN
0.0000 ug/min | INTRAVENOUS | Status: DC
  Administered 2018-10-01: 14 ug/min via INTRAVENOUS
  Administered 2018-10-02: 10 ug/min via INTRAVENOUS
  Administered 2018-10-04: 23:00:00 2 ug/min via INTRAVENOUS
  Filled 2018-10-01 (×3): qty 250

## 2018-10-01 MED ORDER — LACTATED RINGERS IV SOLN
INTRAVENOUS | Status: DC
  Administered 2018-10-01: 02:00:00 via INTRAVENOUS

## 2018-10-01 MED ORDER — COSYNTROPIN 0.25 MG IJ SOLR
0.2500 mg | Freq: Once | INTRAMUSCULAR | Status: AC
  Administered 2018-10-02: 10:00:00 0.25 mg via INTRAVENOUS
  Filled 2018-10-01: qty 0.25

## 2018-10-01 MED ORDER — CHLORHEXIDINE GLUCONATE CLOTH 2 % EX PADS
6.0000 | MEDICATED_PAD | Freq: Every day | CUTANEOUS | Status: DC
  Administered 2018-10-01 – 2018-10-03 (×3): 6 via TOPICAL

## 2018-10-01 MED ORDER — TECHNETIUM TO 99M ALBUMIN AGGREGATED
1.5900 | Freq: Once | INTRAVENOUS | Status: AC | PRN
  Administered 2018-10-01: 12:00:00 1.59 via INTRAVENOUS

## 2018-10-01 MED ORDER — SODIUM CHLORIDE 0.9 % IV SOLN
INTRAVENOUS | Status: DC
  Administered 2018-10-01 – 2018-10-03 (×2): via INTRAVENOUS

## 2018-10-01 MED ORDER — LIDOCAINE HCL (PF) 1 % IJ SOLN
INTRAMUSCULAR | Status: AC
  Administered 2018-10-01: 04:00:00
  Filled 2018-10-01: qty 30

## 2018-10-01 MED ORDER — ONDANSETRON HCL 4 MG/2ML IJ SOLN: 4.0000 mg | Freq: Three times a day (TID) | INTRAMUSCULAR | Status: DC | PRN

## 2018-10-01 MED ORDER — SODIUM CHLORIDE 0.9% FLUSH: 10.0000 mL | INTRAVENOUS | Status: DC | PRN

## 2018-10-01 MED ORDER — HEPARIN (PORCINE) 25000 UT/250ML-% IV SOLN
1200.0000 [IU]/h | INTRAVENOUS | Status: DC
  Administered 2018-10-01: 05:00:00 1200 [IU]/h via INTRAVENOUS
  Filled 2018-10-01: qty 250

## 2018-10-01 MED ORDER — ALBUTEROL SULFATE (2.5 MG/3ML) 0.083% IN NEBU: 2.5000 mg | INHALATION_SOLUTION | RESPIRATORY_TRACT | Status: DC | PRN

## 2018-10-01 MED ORDER — NOREPINEPHRINE 4 MG/250ML-% IV SOLN
0.0000 ug/min | INTRAVENOUS | Status: DC
  Administered 2018-10-01: 12 ug/min via INTRAVENOUS
  Administered 2018-10-01: 04:00:00 2 ug/min via INTRAVENOUS
  Filled 2018-10-01 (×2): qty 250

## 2018-10-01 MED ORDER — SODIUM CHLORIDE 0.9 % IV BOLUS
1000.0000 mL | Freq: Once | INTRAVENOUS | Status: AC
  Administered 2018-10-01: 12:00:00 1000 mL via INTRAVENOUS

## 2018-10-01 MED ORDER — HEPARIN SODIUM (PORCINE) 5000 UNIT/ML IJ SOLN
5000.0000 [IU] | Freq: Three times a day (TID) | INTRAMUSCULAR | Status: DC
  Administered 2018-10-01 – 2018-10-05 (×13): 5000 [IU] via SUBCUTANEOUS
  Filled 2018-10-01 (×13): qty 1

## 2018-10-01 NOTE — Progress Notes (Signed)
VASCULAR LAB PRELIMINARY  PRELIMINARY  PRELIMINARY  PRELIMINARY  Bilateral lower extremity venous duplex completed.    Preliminary report:  See CV proc for preliminary results.     Shannen Flansburg, RVT 10/01/2018, 8:25 PM

## 2018-10-01 NOTE — Progress Notes (Addendum)
NAME:  Autumn Brady, MRN:  [id number], DOB:  [birth year], LOS: 1 ADMISSION DATE:  09/30/2018, CONSULTATION DATE:  09/30/2018 REFERRING MD:  [facility], CHIEF COMPLAINT:  Hypoxic respiratory failure  Brief History   24 yof transferred from [facility], originally admitted for hypoxic respiratory failure being treated there for HCAP and heart failure with progressive AKI with diuresis and hypoxia requiring transferred to [facility] higher level of care.   Past Medical History  Respiratory failure, HTN, syncope, bradycardia, ?HF  Significant Hospital Events   9/16 tx to [facility] from [facility] 9/25 Admit to [facility]; given IVFs for presumed post-diuresis hypotension, qtc prolonged so zofran and levaquin stopped. started on IV heparin for elevated DDimer and high risk for PE (cr prevented assessment via CT-A) 9/26 getting ECHO/VQ and LE dopplers. Na dropping w/ balanced crystalloid resuscitation  Consults:  Cardiology  Procedures:  Right IJ CVL 9/26>>>  Significant Diagnostic Tests:   Micro Data:  OSH COVID 9/17 >> neg  9/26 SARS CoV 2 9/26 BCx2 >> 9/26 UC >>  Antimicrobials:  OHS levaquin  Interim history/subjective:  Coughing.  A little short of breath. Objective    FiO2 (%):  [50 %-100 %] 90 % Blood pressure (Abnormal) 106/52, pulse 67, temperature (Abnormal) 97.3 F (36.3 C), resp. rate (Abnormal) 22, weight 132.2 kg, SpO2 96 %. CVP:  [9 mmHg-15 mmHg] 11 mmHg  FiO2 (%):  [50 %-100 %] 90 %  No intake or output data in the 24 hours ending 10/01/18 0826 Filed Weights   10/01/18 0500  Weight: 132.2 kg    Examination: General this is a 78 year old black female who is lying in bed she is in no acute distress but is coughing up clear frothy sputum HEENT normocephalic atraumatic no jugular venous distention currently her mucous membranes are moist Pulmonary some scattered rhonchi no accessory use she remains on high flow oxygen Cardiac regular rhythm no murmur rub or gallop is appreciated  Extremities lower extremity edema pulses are easily palpable Neuro awake oriented no focal deficits Abdomen soft obese nontender GU clear yellow  Resolved Hospital Problem list    Assessment & Plan:   Acute hypoxic respiratory failure- unclear etiology, has been treated for CAP and diuresed for heart failure - ddx to include pulmonary edema versus acute lung injury versus PE vs infectious or inflammatory etiology.  Wells score 4.5 Portable chest x-ray personally reviewed.  This demonstrates right IJ in satisfactory position.  There is bibasilar volume loss.  Some areas of bilateral patchy interstitial changes aeration is actually improved when comparing admission film Plan Continuing supplemental oxygen Continue therapeutic heparin Awaiting lower extremity ultrasound Awaiting VQ scan Pulse oximetry Echocardiogram will be helpful Incentive spirometry High-resolution CT imaging would be helpful, will hold off on this for now, would pursue this if VQ negative for perfusion deficit  Circulatory shock Appears euvolemic by CVP and urine specific gravity, still pressor dependent Plan Keep euvolemic at this point Follow-up pending culture data Titrate norepinephrine for mean arterial pressure greater than 65 KVO IV fluids at this point  Prolonged QTc, nonspecific ST changes and right bundle blanch block with mildly elevated high-sensitivity troponin - concern for STEMI, appreciate cardiology evaluation who did not feel EKG consistant for acute STE, noted to have RBBB and nonspecific ST changes, unclear previous EKG  Plan Avoiding QTC prolonging medications Follow-up echocardiogram Continue telemetry monitoring  AKI - unclear baseline sCr -This seems to be improving, her CVP is euvolemic she has frothy sputum, I wonder if her creatinine is  improving more with support of mean arterial pressure Plan Follow-up renal ultrasound Renal dose medications Strict intake output A.m. chemistry  KVO IV fluids  Fluid and electrolyte imbalance: Hypoosmolar hyponatremia.  There appears to be some degree of chronicity with this as even 3 years ago her sodium was 131.  Her sodium has fallen from 126-124, with balanced crystalloid resuscitation.  Her specific gravity is not concentrated Plan KVO IV fluids change LR to normal saline  check urine Osmo Check urine sodium Check TSH triglycerides and cortisol  Best practice:  Diet: NPO Pain/Anxiety/Delirium protocol (if indicated): n/a VAP protocol (if indicated): n/a DVT prophylaxis: heparin SQ GI prophylaxis: PPI Glucose control: CBG q 4, add SSI if > 180 Mobility: progress as able Code Status: full  Family Communication: pending Disposition: ICU  She remains critically ill due to her high requirements for oxygen. she is scheduled for VQ scan currently which seems reasonable to rule out thromboembolic event.  If this is negative and ultrasounds are negative we will proceed with high-resolution CT imaging.  For now her x-ray looks little better we will hold off on diuresis and simply keep her euvolemic.  We are awaiting echocardiogram.  I still suspect there could be a cardiac component to this as well particularly if we rule out pulmonary emboli.  My critical care time is 35 minutes Erick Colace ACNP-BC [place] Pager # [phone] OR # [phone] if no answer

## 2018-10-01 NOTE — Progress Notes (Signed)
  Echocardiogram 2D Echocardiogram has been performed.  Autumn Brady 10/01/2018, 12:02 PM

## 2018-10-01 NOTE — Procedures (Addendum)
Central Venous Catheter Insertion Procedure Note Autumn Brady [id number] 12-17-1940  Procedure: Insertion of Central Venous Catheter Indications: Assessment of intravascular volume, Drug and/or fluid administration and Frequent blood sampling  Procedure Details Consent: Risks of procedure as well as the alternatives and risks of each were explained to the (patient/caregiver).  Consent for procedure obtained. Time Out: Verified patient identification, verified procedure, site/side was marked, verified correct patient position, special equipment/implants available, medications/allergies/relevent history reviewed, required imaging and test results available.  Performed  Maximum sterile technique was used including antiseptics, cap, gloves, gown, hand hygiene, mask and sheet. Skin prep: Chlorhexidine; local anesthetic administered A antimicrobial bonded/coated triple lumen catheter was placed in the right internal jugular vein using the Seldinger technique.  Evaluation Blood flow good Complications: No apparent complications Patient did tolerate procedure well. Chest X-ray ordered to verify placement.  CXR: normal.  Daxtin Leiker D Thomasine Klutts 10/01/2018, 4:09 AM

## 2018-10-01 NOTE — Progress Notes (Addendum)
PCCM UPDATE   Pt evaluated with NP Simpson Hypotensive despite IVF bolus Concern for Cardiogenic vs obstructive vs hypovolemia Placed RIJ CVC for Vasoactive infusion.  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

## 2018-10-01 NOTE — Progress Notes (Signed)
ANTICOAGULATION CONSULT NOTE - Initial Consult  Pharmacy Consult for Heparin  Indication: Rule out PE  Allergies  Allergen Reactions  . Accuretic [Quinapril-Hydrochlorothiazide] Other (See Comments)    passed out  . Codeine Anxiety    hyper  . Biaxin [Clarithromycin] Other (See Comments)    disoriented  . Adhesive [Tape] Other (See Comments)    Redness and tears skin, Please use "paper" tape  . Latex Other (See Comments)    Bruising  . Other Other (See Comments)    Anesthesia (pt not sure which one) made her jump off of the table  . Vibramycin [Doxycycline Calcium] Nausea And Vomiting    Vital Signs: BP: 93/40 (09/26 0245) Pulse Rate: 75 (09/26 0145)  Labs: Recent Labs    09/30/18 2324 09/30/18 2327 10/01/18 0237  HGB 11.6* 11.7* 11.1*  HCT 34.0* 35.8* 33.5*  PLT  --  408* 394  APTT  --  30  --   CREATININE  --  2.67* 2.49*  TROPONINIHS  --  131* 105*   CrCl cannot be calculated (Unknown ideal weight.).  Medical History: Past Medical History:  Diagnosis Date  . HTN (hypertension)   . Seasonal allergies    Assessment: 78 y/o F transfer from outside facility with hypoxemic respiratory failure, consulted to start heparin for rule out PE, no imaging yet, D-dimer is elevated, Hgb 11.1, plts good, received subcutaneous heparin about 3 hours ago, requested to given no bolus>just had central line placed.   Goal of Therapy:  Heparin level 0.3-0.7 units/ml Monitor platelets by anticoagulation protocol: Yes   Plan:  -No bolus per MD request  -Start heparin drip at 1200 units/hr -1300 HL -Daily CBC/HL -Monitor for bleeding  Narda Bonds, PharmD, BCPS Clinical Pharmacist Phone: [phone]

## 2018-10-01 NOTE — Progress Notes (Signed)
ANTICOAGULATION CONSULT NOTE - Follow-Up Consult  Pharmacy Consult for Heparin  Indication: Rule out PE  Allergies  Allergen Reactions  . Accuretic [Quinapril-Hydrochlorothiazide] Other (See Comments)    passed out  . Codeine Anxiety    hyper  . Biaxin [Clarithromycin] Other (See Comments)    disoriented  . Adhesive [Tape] Other (See Comments)    Redness and tears skin, Please use "paper" tape  . Latex Other (See Comments)    Bruising  . Other Other (See Comments)    Anesthesia (pt not sure which one) made her jump off of the table  . Vibramycin [Doxycycline Calcium] Nausea And Vomiting    Vital Signs: Temp: 98.8 F (37.1 C) (09/26 1445) BP: 102/73 (09/26 1445) Pulse Rate: 67 (09/26 0443)  Labs: Recent Labs    09/30/18 2324 09/30/18 2327 10/01/18 0237 10/01/18 0923 10/01/18 1502  HGB 11.6* 11.7* 11.1*  --   --   HCT 34.0* 35.8* 33.5*  --   --   PLT  --  408* 394  --   --   APTT  --  30  --   --   --   HEPARINUNFRC  --   --   --   --  0.53  CREATININE  --  2.67* 2.49* 1.97*  --   TROPONINIHS  --  131* 105*  --   --    CrCl cannot be calculated (Unknown ideal weight.).  Medical History: Past Medical History:  Diagnosis Date  . HTN (hypertension)   . Seasonal allergies    Assessment: 78 y/o F transfer from outside facility with hypoxemic respiratory failure, consulted to start heparin for rule out PE, no imaging yet, D-dimer is elevated, Hgb 11.1, plts good, received subcutaneous heparin about 3 hours ago, requested to given no bolus>just had central line placed.   Initial heparin level therapeutic, VQ scan negative, dopplers pending.  Goal of Therapy:  Heparin level 0.3-0.7 units/ml Monitor platelets by anticoagulation protocol: Yes   Plan:  -Continue heparin 1200 units/hr -Daily heparin level and CBC   Arrie Senate, PharmD, BCPS Clinical Pharmacist [phone] Please check AMION for all [facility] Pharmacy numbers 10/01/2018

## 2018-10-02 ENCOUNTER — Inpatient Hospital Stay (HOSPITAL_COMMUNITY): Payer: Medicare Other

## 2018-10-02 ENCOUNTER — Encounter (HOSPITAL_COMMUNITY): Payer: Self-pay

## 2018-10-02 ENCOUNTER — Other Ambulatory Visit: Payer: Self-pay

## 2018-10-02 DIAGNOSIS — J9601 Acute respiratory failure with hypoxia: Secondary | ICD-10-CM

## 2018-10-02 DIAGNOSIS — E871 Hypo-osmolality and hyponatremia: Secondary | ICD-10-CM

## 2018-10-02 DIAGNOSIS — J9602 Acute respiratory failure with hypercapnia: Secondary | ICD-10-CM

## 2018-10-02 DIAGNOSIS — N179 Acute kidney failure, unspecified: Secondary | ICD-10-CM

## 2018-10-02 DIAGNOSIS — I2721 Secondary pulmonary arterial hypertension: Secondary | ICD-10-CM

## 2018-10-02 LAB — BASIC METABOLIC PANEL
Anion gap: 12 (ref 5–15)
BUN: 23 mg/dL (ref 8–23)
CO2: 32 mmol/L (ref 22–32)
Calcium: 8 mg/dL — ABNORMAL LOW (ref 8.9–10.3)
Chloride: 87 mmol/L — ABNORMAL LOW (ref 98–111)
Creatinine, Ser: 0.83 mg/dL (ref 0.44–1.00)
GFR calc Af Amer: >60 mL/min (ref >60)
GFR calc non Af Amer: >60 mL/min (ref >60)
Glucose, Bld: 143 mg/dL — ABNORMAL HIGH (ref 70–99)
Potassium: 3.6 mmol/L (ref 3.5–5.1)
Sodium: 131 mmol/L — ABNORMAL LOW (ref 135–145)

## 2018-10-02 LAB — COOXEMETRY PANEL
Carboxyhemoglobin: 0.9 % (ref 0.5–1.5)
Carboxyhemoglobin: 0.9 % (ref 0.5–1.5)
Methemoglobin: 0.5 % (ref 0.0–1.5)
Methemoglobin: 0.7 % (ref 0.0–1.5)
O2 Saturation: 68.1 %
O2 Saturation: 62.9 %
Total hemoglobin: 12.1 g/dL (ref 12.0–16.0)
Total hemoglobin: 11.5 g/dL — ABNORMAL LOW (ref 12.0–16.0)

## 2018-10-02 LAB — CBC
HCT: 32.2 % — ABNORMAL LOW (ref 36.0–46.0)
Hemoglobin: 10.7 g/dL — ABNORMAL LOW (ref 12.0–15.0)
MCH: 28 pg (ref 26.0–34.0)
MCHC: 33.2 g/dL (ref 30.0–36.0)
MCV: 84.3 fL (ref 80.0–100.0)
Platelets: 389 10*3/uL (ref 150–400)
RBC: 3.82 MIL/uL — ABNORMAL LOW (ref 3.87–5.11)
RDW: 14.6 % (ref 11.5–15.5)
WBC: 12.8 10*3/uL — ABNORMAL HIGH (ref 4.0–10.5)
nRBC: 0 % (ref 0.0–0.2)

## 2018-10-02 LAB — COMPREHENSIVE METABOLIC PANEL
ALT: 16 U/L (ref 0–44)
AST: 16 U/L (ref 15–41)
Albumin: 2.1 g/dL — ABNORMAL LOW (ref 3.5–5.0)
Alkaline Phosphatase: 36 U/L — ABNORMAL LOW (ref 38–126)
Anion gap: 15 (ref 5–15)
BUN: 24 mg/dL — ABNORMAL HIGH (ref 8–23)
CO2: 29 mmol/L (ref 22–32)
Calcium: 8 mg/dL — ABNORMAL LOW (ref 8.9–10.3)
Chloride: 88 mmol/L — ABNORMAL LOW (ref 98–111)
Creatinine, Ser: 0.94 mg/dL (ref 0.44–1.00)
GFR calc Af Amer: >60 mL/min (ref >60)
GFR calc non Af Amer: 58 mL/min — ABNORMAL LOW (ref >60)
Glucose, Bld: 109 mg/dL — ABNORMAL HIGH (ref 70–99)
Potassium: 3.2 mmol/L — ABNORMAL LOW (ref 3.5–5.1)
Sodium: 132 mmol/L — ABNORMAL LOW (ref 135–145)
Total Bilirubin: 1.2 mg/dL (ref 0.3–1.2)
Total Protein: 6.4 g/dL — ABNORMAL LOW (ref 6.5–8.1)

## 2018-10-02 LAB — URINE CULTURE: Culture: NO GROWTH

## 2018-10-02 LAB — LEGIONELLA PNEUMOPHILA SEROGP 1 UR AG: L. pneumophila Serogp 1 Ur Ag: NEGATIVE

## 2018-10-02 LAB — ACTH STIMULATION, 3 TIME POINTS
Cortisol, 30 Min: 24.1 ug/dL
Cortisol, 60 Min: 28 ug/dL
Cortisol, Base: 10 ug/dL

## 2018-10-02 LAB — UREA NITROGEN, URINE: Urea Nitrogen, Ur: 533 mg/dL

## 2018-10-02 MED ORDER — PANTOPRAZOLE SODIUM 40 MG PO TBEC
40.0000 mg | DELAYED_RELEASE_TABLET | Freq: Every day | ORAL | Status: DC
  Administered 2018-10-02 – 2018-10-05 (×4): 40 mg via ORAL
  Filled 2018-10-02 (×4): qty 1

## 2018-10-02 MED ORDER — FUROSEMIDE 10 MG/ML IJ SOLN
40.0000 mg | Freq: Once | INTRAMUSCULAR | Status: AC
  Administered 2018-10-02: 10:00:00 40 mg via INTRAVENOUS
  Filled 2018-10-02: qty 4

## 2018-10-02 MED ORDER — CHLORHEXIDINE GLUCONATE 0.12 % MT SOLN
15.0000 mL | Freq: Two times a day (BID) | OROMUCOSAL | Status: DC
  Administered 2018-10-02 – 2018-10-05 (×8): 15 mL via OROMUCOSAL
  Filled 2018-10-02 (×5): qty 15

## 2018-10-02 MED ORDER — ORAL CARE MOUTH RINSE
15.0000 mL | Freq: Two times a day (BID) | OROMUCOSAL | Status: DC
  Administered 2018-10-03 – 2018-10-05 (×5): 15 mL via OROMUCOSAL

## 2018-10-02 MED ORDER — ONDANSETRON HCL 4 MG/2ML IJ SOLN
INTRAMUSCULAR | Status: AC
  Administered 2018-10-02: 4 mg via INTRAVENOUS
  Filled 2018-10-02: qty 2

## 2018-10-02 MED ORDER — ONDANSETRON HCL 4 MG/2ML IJ SOLN
4.0000 mg | Freq: Once | INTRAMUSCULAR | Status: AC
  Administered 2018-10-02: 03:00:00 4 mg via INTRAVENOUS

## 2018-10-02 MED ORDER — MIDODRINE HCL 5 MG PO TABS
10.0000 mg | ORAL_TABLET | Freq: Three times a day (TID) | ORAL | Status: DC
  Administered 2018-10-02 – 2018-10-05 (×11): 10 mg via ORAL
  Filled 2018-10-02 (×11): qty 2

## 2018-10-02 MED ORDER — POTASSIUM CHLORIDE CRYS ER 20 MEQ PO TBCR
40.0000 meq | EXTENDED_RELEASE_TABLET | Freq: Once | ORAL | Status: AC
  Administered 2018-10-02: 12:00:00 40 meq via ORAL
  Filled 2018-10-02: qty 2

## 2018-10-02 MED ORDER — INFLUENZA VAC A&B SA ADJ QUAD 0.5 ML IM PRSY
0.5000 mL | PREFILLED_SYRINGE | INTRAMUSCULAR | Status: AC
  Administered 2018-10-04: 14:00:00 0.5 mL via INTRAMUSCULAR
  Filled 2018-10-02 (×2): qty 0.5

## 2018-10-02 NOTE — Evaluation (Signed)
Occupational Therapy Evaluation and Discharge Patient Details Name: Autumn Brady MRN: [id number] DOB: June 28, 1940 Today's Date: 10/02/2018    History of Present Illness 4 yof transferred from [facility], originally admitted for hypoxic respiratory failure being treated there for HCAP and heart failure with progressive AKI with diuresis and hypoxia requiring transferred to [facility] higher level of care.  PMH:HTN, CHF, syncope   Clinical Impression   Pt with longstanding dependence in mobility and most ADL. No OT needs.    Follow Up Recommendations  SNF;Supervision/Assistance - 24 hour    Equipment Recommendations       Recommendations for Other Services       Precautions / Restrictions Precautions Precautions: Fall Restrictions Weight Bearing Restrictions: No      Mobility Bed Mobility Overal bed mobility: Needs Assistance Bed Mobility: Rolling Rolling: Mod assist;Min assist;+2 for physical assistance         General bed mobility comments: Rolled once lifted with lift back to bed to remove the maxi sky pad  Transfers Overall transfer level: Needs assistance               General transfer comment: used lift to get pt back in bed from recliner.    Balance                                           ADL either performed or assessed with clinical judgement   ADL Overall ADL's : At baseline                                             Vision Patient Visual Report: No change from baseline       Perception     Praxis      Pertinent Vitals/Pain Pain Assessment: Faces Faces Pain Scale: Hurts whole lot Pain Location: Bil LEs with left LE >RLE Pain Descriptors / Indicators: Aching;Grimacing;Guarding Pain Intervention(s): Monitored during session;Repositioned     Hand Dominance Right   Extremity/Trunk Assessment Upper Extremity Assessment Upper Extremity Assessment: Generalized weakness;Overall WFL for tasks assessed    Lower Extremity Assessment Lower Extremity Assessment: Defer to PT evaluation RLE Deficits / Details: grossly 2-/5 LLE Deficits / Details: grossly 2-/5   Cervical / Trunk Assessment Cervical / Trunk Assessment: Other exceptions Cervical / Trunk Exceptions: morbid obesity   Communication Communication Communication: No difficulties   Cognition Arousal/Alertness: Awake/alert Behavior During Therapy: WFL for tasks assessed/performed Overall Cognitive Status: Within Functional Limits for tasks assessed                                     General Comments  VSS    Exercises     Shoulder Instructions      Home Living Family/patient expects to be discharged to:: Skilled nursing facility                                 Additional Comments: was at [facility] PTA       Prior Functioning/Environment Level of Independence: Needs assistance  Gait / Transfers Assistance Needed: bedbound, min to mod assist with rolling ADL's / Homemaking Assistance Needed: total care with exception of  self feeding and participating in some grooming            OT Problem List:        OT Treatment/Interventions:      OT Goals(Current goals can be found in the care plan section) Acute Rehab OT Goals Patient Stated Goal: to not go back to [facility]  OT Frequency:     Barriers to D/C:            Co-evaluation PT/OT/SLP Co-Evaluation/Treatment: Yes Reason for Co-Treatment: Complexity of the patient's impairments (multi-system involvement);For patient/therapist safety PT goals addressed during session: Mobility/safety with mobility OT goals addressed during session: ADL's and self-care      AM-PAC OT "6 Clicks" Daily Activity     Outcome Measure Help from another person eating meals?: A Little Help from another person taking care of personal grooming?: A Little Help from another person toileting, which includes using toliet, bedpan, or urinal?: Total Help from  another person bathing (including washing, rinsing, drying)?: Total Help from another person to put on and taking off regular upper body clothing?: Total Help from another person to put on and taking off regular lower body clothing?: Total 6 Click Score: 10   End of Session Equipment Utilized During Treatment: Oxygen  Activity Tolerance: Patient tolerated treatment well Patient left: in bed;with call bell/phone within reach;with nursing/sitter in room  OT Visit Diagnosis: Muscle weakness (generalized) (M62.81);Pain                Time: 8115-7262 OT Time Calculation (min): 17 min Charges:  OT General Charges $OT Visit: 1 Visit OT Evaluation $OT Eval Moderate Complexity: 1 Mod  Nestor Lewandowsky, OTR/L Acute Rehabilitation Services Pager: [phone] Office: [phone]  Autumn Brady 10/02/2018, 9:58 AM

## 2018-10-02 NOTE — Procedures (Signed)
SGC placement  Timeout performed  Site prepped and draped in sterile fashion  [place] used to place introducer into left internal jugular vein  Then with Dr. Clayborne Dana assistance, [place] advanced into pulmonary artery using pressure tracing and usual attention to balloon inflation/deflation precautionary measures.  Wedge around 18 mmHg, MPP 51, TPG 33, await thermodilution CO, CVP measurements.  CXR ordered to confirm placement.

## 2018-10-02 NOTE — Evaluation (Signed)
Physical Therapy Evaluation and D/C Patient Details Name: Autumn Brady MRN: [id number] DOB: 1940/05/01 Today's Date: 10/02/2018   History of Present Illness  45 yof transferred from [place], originally admitted for hypoxic respiratory failure being treated there for HCAP and heart failure with progressive AKI with diuresis and hypoxia requiring transferred to [facility] higher level of care.  PMH:HTN, CHF, syncope  Clinical Impression  Pt admitted with above diagnosis. Pt bedbound PTA at [facility] per chart.  Pt cannot move her LEs much and it is painful if she attempts to move them.  Only helps some with rolling. Pt is at baseline and is not appropriate for PT at this time.  REcommend SNF with total care.  Will sign off.    Follow Up Recommendations SNF;Supervision/Assistance - 24 hour    Equipment Recommendations  None recommended by PT    Recommendations for Other Services       Precautions / Restrictions Precautions Precautions: Fall Restrictions Weight Bearing Restrictions: No      Mobility  Bed Mobility Overal bed mobility: Needs Assistance Bed Mobility: Rolling Rolling: Mod assist;Min assist;+2 for physical assistance         General bed mobility comments: Rolled once lifted with lift back to bed to remove the maxi sky pad  Transfers Overall transfer level: Needs assistance               General transfer comment: used lift to get pt back in bed from recliner.  Ambulation/Gait             General Gait Details: unable PTA - pt bedbound  Stairs            Wheelchair Mobility    Modified Rankin (Stroke Patients Only)       Balance                                             Pertinent Vitals/Pain Pain Assessment: Faces Faces Pain Scale: Hurts whole lot Pain Location: Bil LEs with left LE >RLE Pain Descriptors / Indicators: Aching;Grimacing;Guarding Pain Intervention(s): Limited activity within patient's tolerance;Monitored  during session;Repositioned    Home Living Family/patient expects to be discharged to:: Skilled nursing facility                 Additional Comments: was at [facility] PTA     Prior Function Level of Independence: Needs assistance   Gait / Transfers Assistance Needed: bedbound, min to mod assist with rolling  ADL's / Homemaking Assistance Needed: Total care        Hand Dominance        Extremity/Trunk Assessment   Upper Extremity Assessment Upper Extremity Assessment: Defer to OT evaluation    Lower Extremity Assessment Lower Extremity Assessment: RLE deficits/detail;LLE deficits/detail RLE Deficits / Details: grossly 2-/5 LLE Deficits / Details: grossly 2-/5       Communication   Communication: No difficulties  Cognition Arousal/Alertness: Awake/alert Behavior During Therapy: WFL for tasks assessed/performed Overall Cognitive Status: Within Functional Limits for tasks assessed                                        General Comments General comments (skin integrity, edema, etc.): VSS    Exercises     Assessment/Plan    PT Assessment Patent  does not need any further PT services  PT Problem List         PT Treatment Interventions      PT Goals (Current goals can be found in the Care Plan section)  Acute Rehab PT Goals Patient Stated Goal: to not go back to [facility] PT Goal Formulation: All assessment and education complete, DC therapy    Frequency     Barriers to discharge        Co-evaluation PT/OT/SLP Co-Evaluation/Treatment: Yes Reason for Co-Treatment: Complexity of the patient's impairments (multi-system involvement);For patient/therapist safety PT goals addressed during session: Mobility/safety with mobility         AM-PAC PT "6 Clicks" Mobility  Outcome Measure Help needed turning from your back to your side while in a flat bed without using bedrails?: Total Help needed moving from lying on your back to sitting on  the side of a flat bed without using bedrails?: Total Help needed moving to and from a bed to a chair (including a wheelchair)?: Total Help needed standing up from a chair using your arms (e.g., wheelchair or bedside chair)?: Total Help needed to walk in hospital room?: Total Help needed climbing 3-5 steps with a railing? : Total 6 Click Score: 6    End of Session Equipment Utilized During Treatment: Gait belt;Oxygen Activity Tolerance: Patient limited by fatigue Patient left: in bed;with call bell/phone within reach;with nursing/sitter in room Nurse Communication: Mobility status;Need for lift equipment PT Visit Diagnosis: Muscle weakness (generalized) (M62.81);Pain Pain - Right/Left: (bil) Pain - part of body: Leg    Time: 7253-6644 PT Time Calculation (min) (ACUTE ONLY): 17 min   Charges:   PT Evaluation $PT Eval Moderate Complexity: 1 Mod          Guelda Batson,PT Acute Rehabilitation Services Pager:  [phone]  Office:  [phone]    Berline Lopes 10/02/2018, 9:32 AM

## 2018-10-02 NOTE — Consult Note (Signed)
Advanced Heart Failure Team Consult Note   Primary Physician: Andres Shad, MD PCP-Cardiologist:  No primary care provider on file.  Reason for Consultation: RV failure. Shock  HPI:    Autumn Brady is seen today for evaluation of RV failure and shock at the request of Dr. Erskine Emery (CCM)  Ms. 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  She said she was in good health until about 3 years ago when she sustained a fall and her health has declined since then and has been using a walker to get a round. Denies any h/o known heart disease except for a "murmur".   Admitted to [facility] after Labor Day with another fall and abdominal pain and diarrhea.  Hospitalization was complicated by acute hypoxic respiratory failure and unable to wean off HFNC. Transferred to [facility] on 9/16. She was treated with Levaquin for possible infectious etiology. Also being diuresed for heart failure with progressive AKI, hyponatremia and persistent hypoxia.   Transferred to [facility] on the evening of 9/25 for higher level of care not available at [place]. CCM admitted. In ER patient hypotensive. With SBP low 90s. Satting 100 on NRB. Creatinine 2.49 with sodium 124. ECG with NSR in 80s with RBBB. CXR with low volumes.   Central line placed. Started on IVF. Initial co-ox 61%   Now in ICU on 70% HFNC. On NE at 65 to support BP. Midodrine started. Creatinine improved to 0.9. VQ 9/26 No PE  Echo LVEF 65% RV dilated an moderately HK. Septal flattening RVSP 45mmHG Personally reviewed  Swan placed by Dr. Tamala Julian. I assisted in obtaining pressures. Swan numbers CVP 7 PA 55/35 (42) PCWP 22 Thermo 5.8/2.5 PVR 3.5 SVR 940  Says she currently feels better. Denies CP or SOB. + cough.   Review of Systems: [y] = yes, [ ]  = no   . General: Weight gain [  ]; Weight loss [ ] ; Anorexia [ ] ; Fatigue [ y]; Fever [ ] ; Chills [ ] ; Weakness Blue.Reese ]  . Cardiac: Chest pain/pressure [ ] ; Resting SOB [ y]; Exertional SOB Blue.Reese ]; Orthopnea Blue.Reese ]; Pedal Edema [ ] ; Palpitations [ ] ; Syncope [ y]; Presyncope [ ] ; Paroxysmal nocturnal dyspnea[ ]   . Pulmonary: Cough [ y]; Wheezing[ ] ; Hemoptysis[ ] ; Sputum [ ] ; Snoring [ ]   . GI: Vomiting[ ] ; Dysphagia[ ] ; Melena[ ] ; Hematochezia [ ] ; Heartburn[ ] ; Abdominal pain [ ] ; Constipation [ ] ; Diarrhea [ ] ; BRBPR [ ]   . GU: Hematuria[ ] ; Dysuria [ ] ; Nocturia[ ]   . Vascular: Pain in legs with walking Blue.Reese ]; Pain in feet with lying flat [ ] ; Non-healing sores [ ] ; Stroke [ ] ; TIA [ ] ; Slurred speech [ ] ;  . Neuro: Headaches[ ] ; Vertigo[ ] ; Seizures[ ] ; Paresthesias[ ] ;Blurred vision [ ] ; Diplopia [ ] ; Vision changes [ ]   . Ortho/Skin: Arthritis Blue.Reese ]; Joint pain Blue.Reese ]; Muscle pain [ ] ; Joint swelling [ ] ; Back Pain [ ] ; Rash [ ]   . Psych: Depression[ ] ; Anxiety[ ]   . Heme: Bleeding problems [ ] ; Clotting disorders [ ] ; Anemia [ ]   . Endocrine: Diabetes [ ] ; Thyroid dysfunction[ ]   Home Medications Prior to Admission medications   Medication Sig Start Date End Date Taking? Authorizing Provider  ADVAIR DISKUS 250-50 MCG/DOSE AEPB 1 puff 2 (two) times  daily. 06/14/15   [provider]  aspirin EC 81 MG tablet Take 81 mg by mouth daily at 12 noon.    [provider]  Calcium Carbonate-Vitamin D (CALCIUM-D PO) Take 1 tablet by mouth 2 (two) times daily.    [provider]  cyclobenzaprine (FLEXERIL) 10 MG tablet Take 10 mg by mouth every 8 (eight) hours as needed for muscle spasms.  06/26/15   [provider]  docusate sodium (COLACE) 100 MG capsule Take 1 capsule (100 mg total) by mouth 2 (two) times daily. 07/06/15   Lora Paula, MD  montelukast (SINGULAIR) 10 MG tablet Take 10 mg by mouth every morning. 04/05/15   [provider]  raloxifene (EVISTA) 60 MG tablet Take 60 mg by mouth every  morning. 06/14/15   [provider]    Past Medical History: Past Medical History:  Diagnosis Date  . HTN (hypertension)   . Seasonal allergies     Past Surgical History: No past surgical history on file.  Family History: No family history on file.  Social History: Social History   Socioeconomic History  . Marital status: Unknown    Spouse name: Not on file  . Number of children: Not on file  . Years of education: Not on file  . Highest education level: Not on file  Occupational History  . Not on file  Social Needs  . Financial resource strain: Not on file  . Food insecurity    Worry: Not on file    Inability: Not on file  . Transportation needs    Medical: Not on file    Non-medical: Not on file  Tobacco Use  . Smoking status: Never Smoker  . Smokeless tobacco: Never Used  Substance and Sexual Activity  . Alcohol use: No  . Drug use: Not on file  . Sexual activity: Not on file  Lifestyle  . Physical activity    Days per week: Not on file    Minutes per session: Not on file  . Stress: Not on file  Relationships  . Social Musician on phone: Not on file    Gets together: Not on file    Attends religious service: Not on file    Active member of club or organization: Not on file    Attends meetings of clubs or organizations: Not on file    Relationship status: Not on file  Other Topics Concern  . Not on file  Social History Narrative  . Not on file    Allergies:  Allergies  Allergen Reactions  . Accuretic [Quinapril-Hydrochlorothiazide] Other (See Comments)    passed out  . Codeine Anxiety    hyper  . Biaxin [Clarithromycin] Other (See Comments)    disoriented  . Adhesive [Tape] Other (See Comments)    Redness and tears skin, Please use "paper" tape  . Latex Other (See Comments)    Bruising  . Other Other (See Comments)    Anesthesia (pt not sure which one) made her jump off of the table  . Vibramycin [Doxycycline Calcium]  Nausea And Vomiting    Objective:    Vital Signs:   Temp:  [98.4 F (36.9 C)-100 F (37.8 C)] 98.8 F (37.1 C) (09/27 1000) Resp:  [16-29] 25 (09/27 1000) BP: (102-131)/(44-74) 105/74 (09/27 1000) SpO2:  [91 %-100 %] 95 % (09/27 0900) FiO2 (%):  [65 %-70 %] 65 % (09/27 0810) Weight:  [132.2 kg] 132.2 kg (09/27 0600) Last BM  Date: (PTA)  Weight change: Filed Weights   10/01/18 0500 10/02/18 0600  Weight: 132.2 kg 132.2 kg    Intake/Output:   Intake/Output Summary (Last 24 hours) at 10/02/2018 1419 Last data filed at 10/02/2018 1100 Gross per 24 hour  Intake 741.98 ml  Output 1855 ml  Net -1113.02 ml      Physical Exam    General:  Elderly obese woman lying in bed on HFNC No resp difficulty HEENT: normal Neck: supple. RIJ TLC LIJ swan . Carotids 2+ bilat; no bruits. No lymphadenopathy or thyromegaly appreciated. Cor: PMI nondisplaced. Regular rate & rhythm. 2/ TR loud P2 Lungs: clear anteriorly  Abdomen: obese soft, nontender, nondistended. No hepatosplenomegaly. No bruits or masses. Good bowel sounds. Extremities: no cyanosis, clubbing, rash, edema tender to tuch warm Neuro: alert & orientedx3, cranial nerves grossly intact. moves all 4 extremities w/o difficulty. Affect pleasant   Telemetry    Sinus tach 90-110 Personally reviewed   EKG    NSR 84 RBBB No ST-T wave abnormalities. Personally reviewed   Labs   Basic Metabolic Panel: Recent Labs  Lab 09/30/18 2327 10/01/18 0237 10/01/18 0923 10/01/18 1629 10/02/18 1026  NA 126* 124* 125* 128* 132*  K 4.0 4.1 3.7 3.6 3.2*  CL 76* 79* 79* 83* 88*  CO2 40* 31 34* 30 29  GLUCOSE 118* 109* 110* 113* 109*  BUN 46* 47* 43* 37* 24*  CREATININE 2.67* 2.49* 1.97* 1.55* 0.94  CALCIUM 8.2* 7.9* 7.8* 7.8* 8.0*  MG 1.9  --   --   --   --   PHOS 3.0  --   --   --   --     Liver Function Tests: Recent Labs  Lab 09/30/18 2327 10/02/18 1026  AST 21 16  ALT 19 16  ALKPHOS 48 36*  BILITOT 0.6 1.2  PROT 7.5  6.4*  ALBUMIN 2.3* 2.1*   No results for input(s): LIPASE, AMYLASE in the last 168 hours. No results for input(s): AMMONIA in the last 168 hours.  CBC: Recent Labs  Lab 09/30/18 2324 09/30/18 2327 10/01/18 0237 10/02/18 0513  WBC  --  9.3 8.8 12.8*  NEUTROABS  --  5.0  --   --   HGB 11.6* 11.7* 11.1* 10.7*  HCT 34.0* 35.8* 33.5* 32.2*  MCV  --  85.4 84.4 84.3  PLT  --  408* 394 389    Cardiac Enzymes: No results for input(s): CKTOTAL, CKMB, CKMBINDEX, TROPONINI in the last 168 hours.  BNP: BNP (last 3 results) Recent Labs    09/30/18 2327  BNP 974.3*    ProBNP (last 3 results) No results for input(s): PROBNP in the last 8760 hours.   CBG: No results for input(s): GLUCAP in the last 168 hours.  Coagulation Studies: No results for input(s): LABPROT, INR in the last 72 hours.   Imaging   Dg Chest Port 1 View  Result Date: 10/02/2018 CLINICAL DATA:  Shortness of breath. EXAM: PORTABLE CHEST 1 VIEW COMPARISON:  Radiograph of October 01, 2018. FINDINGS: Stable cardiomediastinal silhouette. Right internal jugular catheter is unchanged. No pneumothorax is noted. Stable bibasilar opacities are noted concerning for pneumonia or atelectasis. No significant pleural effusion is noted. Bony thorax is unremarkable. IMPRESSION: Stable bibasilar opacities as described above. Electronically Signed   By: Lupita Raider M.D.   On: 10/02/2018 09:26   Vas [place] Lower Extremity Venous (dvt)  Result Date: 10/02/2018  Lower Venous Study Indications: Edema, Pain, and SOB.  Limitations: Body  habitus and patient intolerant to compression. Comparison Study: No prior study Performing Technologist: Sherren Kerns RVS  Examination Guidelines: A complete evaluation includes B-mode imaging, spectral Doppler, color Doppler, and power Doppler as needed of all accessible portions of each vessel. Bilateral testing is considered an integral part of a complete examination. Limited examinations for  reoccurring indications may be performed as noted.  +---------+---------------+---------+-----------+----------+-------------------+ RIGHT    CompressibilityPhasicitySpontaneityPropertiesThrombus Aging      +---------+---------------+---------+-----------+----------+-------------------+ CFV                     Yes      Yes                  patent by color and                                                       Doppler             +---------+---------------+---------+-----------+----------+-------------------+ SFJ                     Yes      Yes                  patent by color and                                                       Doppler             +---------+---------------+---------+-----------+----------+-------------------+ FV Prox  Full                                                             +---------+---------------+---------+-----------+----------+-------------------+ FV Mid                  Yes      Yes                  patent by color and                                                       Doppler             +---------+---------------+---------+-----------+----------+-------------------+ FV Distal               Yes      Yes                  patent by color and                                                       Doppler             +---------+---------------+---------+-----------+----------+-------------------+  PFV                                                   Not visualized      +---------+---------------+---------+-----------+----------+-------------------+ POP                                                   patent by color and                                                       Doppler             +---------+---------------+---------+-----------+----------+-------------------+ PTV                                                   Not visualized       +---------+---------------+---------+-----------+----------+-------------------+ PERO                                                  Not visualized      +---------+---------------+---------+-----------+----------+-------------------+   Right Technical Findings: Not visualized segments include posterior tibial and peroneal veins.  +---------+---------------+---------+-----------+----------+-------------------+ LEFT     CompressibilityPhasicitySpontaneityPropertiesThrombus Aging      +---------+---------------+---------+-----------+----------+-------------------+ CFV                     Yes      Yes                  patent by color and                                                       Doppler             +---------+---------------+---------+-----------+----------+-------------------+ SFJ                                                   patent by color and                                                       Doppler             +---------+---------------+---------+-----------+----------+-------------------+ FV Prox  patent by color and                                                       Doppler             +---------+---------------+---------+-----------+----------+-------------------+ FV Mid                                                patent by color and                                                       Doppler             +---------+---------------+---------+-----------+----------+-------------------+ FV Distal                                             patent by color and                                                       Doppler             +---------+---------------+---------+-----------+----------+-------------------+ PFV                                                   Not visualized       +---------+---------------+---------+-----------+----------+-------------------+ POP                                                   patent by color and                                                       Doppler             +---------+---------------+---------+-----------+----------+-------------------+ PTV                                                   Not visualized      +---------+---------------+---------+-----------+----------+-------------------+ [facility]  Not visualized      +---------+---------------+---------+-----------+----------+-------------------+   Left Technical Findings: Not visualized segments include posterior tibial and peroneal veins.   Summary: Right: There is no evidence of deep vein thrombosis in the lower extremity. However, portions of this examination were limited- see technologist comments above. Left: There is no evidence of deep vein thrombosis in the lower extremity. However, portions of this examination were limited- see technologist comments above.  *See table(s) above for measurements and observations. Electronically signed by Gretta Began MD on 10/02/2018 at 9:49:17 AM.    Final       Medications:     Current Medications: . chlorhexidine  15 mL Mouth Rinse BID  . Chlorhexidine Gluconate Cloth  6 each Topical Daily  . heparin injection (subcutaneous)  5,000 Units Subcutaneous Q8H  . [START ON 10/03/2018] influenza vaccine adjuvanted  0.5 mL Intramuscular Tomorrow-1000  . mouth rinse  15 mL Mouth Rinse q12n4p  . midodrine  10 mg Oral TID WC  . pantoprazole  40 mg Oral Q1200  . sodium chloride flush  10-40 mL Intracatheter Q12H     Infusions: . sodium chloride 10 mL/hr at 10/02/18 1000  . norepinephrine (LEVOPHED) Adult infusion 16 mcg/min (10/02/18 1000)  . sodium chloride         Assessment/Plan   1. Acute respiratory failure - suspect multifactorial due to  undiagnosed/end-stage OSA/OHS and diastolic HF - ABG 7.46/56/46/83% + bicarb 40 on admit consistent with chronic CO2 retention - denies h/o tobacco use.  - VQ negative 9/26 - suspect she was dry on admit but now PCWP rising slowly. Will need to keep lungs dry - O2 wean per CCM - may need home bipap +/- trach  2. Pulmonary hypertension - echo 9/26 Normal LV + significant RV strain with RVPS - Swan numbers better than I expected but RV parameters likely improved with NE. Shows mixed pulmonary venous/arterial HTN with PVR 3.5 - suspect Who group II and III (diastolic HF and OHS) - not candidate for selective pulmonary artery vasodilators at this point  3. Hypotension/shock - likely due to volume depletion and RV failure.  - ? Possible amyloid - wean NE as tolerated. Continue midodrine  4. Acute on chronic diastolic HF - management as above - consider PYP scan  5. AKI - resolved with IVF  6. Hyponatremia - due to HF and diuresis -  Improving. Can use tolvaptan as needed  7. Morbid obesity - desperately needs weight loss.   8. Hypokalemia - supp. Start spiro as tolerated.   CRITICAL CARE Performed by: Arvilla Meres  Total critical care time: 60 minutes  Critical care time was exclusive of separately billable procedures and treating other patients.  Critical care was necessary to treat or prevent imminent or life-threatening deterioration.  Critical care was time spent personally by me (independent of midlevel providers or residents) on the following activities: development of treatment plan with patient and/or surrogate as well as nursing, discussions with consultants, evaluation of patient's response to treatment, examination of patient, obtaining history from patient or surrogate, ordering and performing treatments and interventions, ordering and review of laboratory studies, ordering and review of radiographic studies, pulse oximetry and re-evaluation of  patient's condition.    Length of Stay: 2  Arvilla Meres, MD  10/02/2018, 2:19 PM  Advanced Heart Failure Team Pager [phone] (M-F; 7a - 4p)  Please contact [facility] for night-coverage after hours (4p -7a ) and weekends on [email or web address]

## 2018-10-02 NOTE — Progress Notes (Addendum)
NAME:  Autumn Brady, MRN:  [id number], DOB:  [birth year], LOS: 2 ADMISSION DATE:  09/30/2018, CONSULTATION DATE:  09/30/2018 REFERRING MD:  [facility], CHIEF COMPLAINT:  Hypoxic respiratory failure  Brief History   78 yof transferred from [place], originally admitted for hypoxic respiratory failure being treated there for HCAP and heart failure with progressive AKI with diuresis and hypoxia requiring transferred to [facility] higher level of care.   Past Medical History  Respiratory failure, HTN, syncope, bradycardia, ?HF  Significant Hospital Events   9/16 tx to [facility] 9/25 Admit to [facility]; given IVFs for presumed post-diuresis hypotension, qtc prolonged so zofran and levaquin stopped. started on IV heparin for elevated DDimer and high risk for PE (cr prevented assessment via CT-A) 9/26 getting ECHO/VQ and LE dopplers. Na dropping w/ balanced crystalloid resuscitation. 9/27: Sodium and creatinine improving.  Still on high flow oxygen but x-ray perhaps a little better physically looks better.  Still requiring nonrebreather  Consults:  Cardiology  Procedures:  Right IJ CVL 9/26>>>  Significant Diagnostic Tests:  Echocardiogram 9/26: Hyperdynamic left ventricle with EF 70 to 75% and severe LV septal wall thickness.  Severe LVH.  LV diastolic filling dysfunction noted.  Global RV systolic function normal however the RV was severely enlarged.  RV severely dilated consistent with RV overload.  Estimated pulmonary artery systolic pressure of 81 mmHg, assuming a right atrial pressure 15 mmHg Renal ultrasound 9/26: Negative Nuclear medicine perfusion study 9/26- Micro Data:  OSH COVID 9/17 >> neg  9/26 SARS CoV 2 9/26 BCx2 >> 9/26 UC >>  Antimicrobials:  OHS levaquin  Interim history/subjective:  She feels better  Objective    FiO2 (%):  [70 %-80 %] 70 % Blood pressure (Abnormal) 131/46, pulse 67, temperature 99 F (37.2 C), temperature source Bladder, resp. rate (Abnormal) 23,  height 5\' 6"  (1.676 m), weight 132.2 kg, SpO2 97 %. CVP:  [6 mmHg-19 mmHg] 13 mmHg  FiO2 (%):  [70 %-80 %] 70 %   Intake/Output Summary (Last 24 hours) at 10/02/2018 0800 Last data filed at 10/01/2018 1800 Gross per 24 hour  Intake 1361.79 ml  Output 955 ml  Net 406.79 ml   Filed Weights   10/01/18 0500  Weight: 132.2 kg    Examination:  General 78 year old black female sitting up in bed she is in no acute distress currently HEENT normocephalic atraumatic mucous membranes moist Pulmonary some scattered rhonchi no accessory use remains on high flow heated oxygen but weaning this Cardiac: Regular rate and rhythm systolic murmur Abdomen soft nontender Extremities warm dry increased lower extremity edema Neuro awake oriented more interactive no focal deficits generalized weakness GU clear yellow urine  Resolved Hospital Problem list    Assessment & Plan:   Acute hypoxic respiratory failure-favoring pulmonary edema and atx Portable chest x-ray personally reviewed.  This continues to show bilateral pulmonary edema aeration looks a little better but this is probably secondary to better inspiratory view Plan Continue supplemental oxygen  IV diuresis, this will need to be reassessed daily  incentive spirometry  Mobilization   Cardiogenic shock with severe right ventricular  Overload and diastolic heart failure Severe pulmonary  hypertension Plan Continue IV diuresis Continue norepinephrine, will ask the heart failure team to see her Check SVO2 I wonder if if she would benefit from inotropic support for her RV  Prolonged QTc, nonspecific ST changes and right bundle blanch block with mildly elevated high-sensitivity troponin - concern for STEMI, appreciate cardiology evaluation who did not feel  EKG consistant for acute STE, noted to have RBBB and nonspecific ST changes, unclear previous EKG  Plan Avoiding QTC prolonging medicines Continue telemetry monitoring  AKI - unclear  baseline sCr; renal ultrasound was negative.  Serial serum creatinines improving, I still think this is more from augmenting her mean arterial pressure and cardiac function Plan Adjust medications for renal clearance Continue IV diuresis Strict intake and output Ensure mean arterial pressure greater than 65  Fluid and electrolyte imbalance: Hypoosmolar hyponatremia: Consistent with hypervolemia as evidenced by low normal urine sodium; improved with IV diuresis plan Keep fluids at [facility] Repeat Lasix, CVP 13 A.m. chemistry Replace electrolytes as indicated  Possible adrenal insuff Plan F/u ACTH stim test   Best practice:  Diet: NPO Pain/Anxiety/Delirium protocol (if indicated): n/a VAP protocol (if indicated): n/a DVT prophylaxis: heparin SQ GI prophylaxis: PPI Glucose control: CBG q 4, add SSI if > 180 Mobility: progress as able Code Status: full  Family Communication: pending Disposition: ICU    She remains critically ill.  I think our primary focus should be augmenting her cardiac function to facilitate adequate cardiac output and LV function.  Echo suggesting RV overload.  Clinically I think she looks like this is well.  She also has severe pulmonary hypertension.  For now we will check SCV O2, continue norepinephrine to maintain mean arterial pressure.  Going to ask cardiology service to see her.  Continue to wean FiO2.  We will closely monitor electrolytes  My cct 45 minutes  Simonne Martinet ACNP-BC [facility] Pul[facility]/Critical Care Pager # [phone] OR # [phone] if no answer

## 2018-10-03 ENCOUNTER — Inpatient Hospital Stay (HOSPITAL_COMMUNITY): Payer: Medicare Other

## 2018-10-03 LAB — CBC
HCT: 31.4 % — ABNORMAL LOW (ref 36.0–46.0)
Hemoglobin: 10.4 g/dL — ABNORMAL LOW (ref 12.0–15.0)
MCH: 28.1 pg (ref 26.0–34.0)
MCHC: 33.1 g/dL (ref 30.0–36.0)
MCV: 84.9 fL (ref 80.0–100.0)
Platelets: 309 10*3/uL (ref 150–400)
RBC: 3.7 MIL/uL — ABNORMAL LOW (ref 3.87–5.11)
RDW: 14.8 % (ref 11.5–15.5)
WBC: 10.8 10*3/uL — ABNORMAL HIGH (ref 4.0–10.5)
nRBC: 0 % (ref 0.0–0.2)

## 2018-10-03 MED ORDER — DIGOXIN 125 MCG PO TABS
0.1250 mg | ORAL_TABLET | Freq: Every day | ORAL | Status: DC
  Administered 2018-10-03 – 2018-10-05 (×3): 0.125 mg via ORAL
  Filled 2018-10-03 (×3): qty 1

## 2018-10-03 MED ORDER — BENZONATATE 100 MG PO CAPS
100.0000 mg | ORAL_CAPSULE | Freq: Three times a day (TID) | ORAL | Status: DC | PRN
  Administered 2018-10-03 – 2018-10-04 (×3): 100 mg via ORAL
  Filled 2018-10-03 (×3): qty 1

## 2018-10-03 MED ORDER — ACETAMINOPHEN 325 MG PO TABS
650.0000 mg | ORAL_TABLET | Freq: Four times a day (QID) | ORAL | Status: DC | PRN
  Administered 2018-10-03 – 2018-10-04 (×2): 650 mg via ORAL
  Filled 2018-10-03 (×3): qty 2

## 2018-10-03 MED ORDER — POTASSIUM CHLORIDE CRYS ER 20 MEQ PO TBCR
40.0000 meq | EXTENDED_RELEASE_TABLET | Freq: Once | ORAL | Status: AC
  Administered 2018-10-03: 10:00:00 40 meq via ORAL
  Filled 2018-10-03: qty 2

## 2018-10-03 NOTE — Progress Notes (Signed)
eLink Physician-Brief Progress Note Patient Name: Natausha Jungwirth DOB: 01-May-1940 MRN: [id number]   Date of Service  10/03/2018  HPI/Events of Note  Patient complaining of knee pain  eICU Interventions  Tylenol 650 q 6 prn ordered     Intervention Category Intermediate Interventions: Pain - evaluation and management  Judd Lien 10/03/2018, 8:19 PM

## 2018-10-03 NOTE — Progress Notes (Signed)
Responded to spiritual care consult for Advanced Directive.  I gave Ms. Autumn Brady the paperwork and she will let nurse know if she would like to complete it.  She stated that she did not want to do anything right now, but that she did want to eventually do an Advanced Directive.  She is not ready at this time to complete it due to not having her glasses and wanting some time to rest.   Will follow-up as needed.

## 2018-10-03 NOTE — Progress Notes (Signed)
NAME:  Autumn Brady, MRN:  [id number], DOB:  November 08, 1940, LOS: 3 ADMISSION DATE:  09/30/2018, CONSULTATION DATE:  09/30/2018 REFERRING MD:  [facility], CHIEF COMPLAINT:  Hypoxic respiratory failure  Brief History   78 yof transferred from [place], originally admitted for hypoxic respiratory failure being treated there for HCAP and heart failure with progressive AKI with diuresis and hypoxia requiring transferred to [facility] higher level of care.   Past Medical History  Respiratory failure, HTN, syncope, bradycardia, ?HF  Significant Hospital Events   9/16 tx to [facility] from [facility] 9/25 Admit to [facility]; given IVFs for presumed post-diuresis hypotension, qtc prolonged so zofran and levaquin stopped. started on IV heparin for elevated DDimer and high risk for PE (cr prevented assessment via CT-A) 9/26 getting ECHO/VQ and LE dopplers. Na dropping w/ balanced crystalloid resuscitation. 9/27: Sodium and creatinine improving.  Still on high flow oxygen but x-ray perhaps a little better physically looks better.  Still requiring nonrebreather.  PA catheter placed showing low normal filling pressures diuresis held off continuing norepinephrine heart failure keep close welted 9/28 still on high flow oxygen weaning norepinephrine demonstrating significant pulmonary hypertension  Consults:  Cardiology  Procedures:  Right IJ CVL 9/26>>> Left IJ Swan-Ganz catheter 9/27 Significant Diagnostic Tests:  Echocardiogram 9/26: Hyperdynamic left ventricle with EF 70 to 75% and severe LV septal wall thickness.  Severe LVH.  LV diastolic filling dysfunction noted.  Global RV systolic function normal however the RV was severely enlarged.  RV severely dilated consistent with RV overload.  Estimated pulmonary artery systolic pressure of 81 mmHg, assuming a right atrial pressure 15 mmHg Renal ultrasound 9/26: Negative Nuclear medicine perfusion study 9/26-negative  Micro Data:  OSH COVID 9/17 >> neg  9/26 SARS CoV 2  9/26 BCx2 >> 9/26 UC >>  Antimicrobials:  OHS levaquin  Interim history/subjective:  Feels about the same Objective    FiO2 (%):  [70 %] 70 % Blood pressure 101/64, pulse (Abnormal) 130, temperature 98.8 F (37.1 C), resp. rate (Abnormal) 28, height 5\' 6"  (1.676 m), weight 133.3 kg, SpO2 94 %. PAP: (42-73)/(28-46) 60/37 CVP:  [2 mmHg-24 mmHg] 9 mmHg PCWP:  [14 mmHg-22 mmHg] 14 mmHg CO:  [5.6 L/min-5.7 L/min] 5.6 L/min CI:  [2.4 L/min/m2] 2.4 L/min/m2  FiO2 (%):  [70 %] 70 %   Intake/Output Summary (Last 24 hours) at 10/03/2018 0856 Last data filed at 10/03/2018 0800 Gross per 24 hour  Intake 626.12 ml  Output 1335 ml  Net -708.88 ml   Filed Weights   10/01/18 0500 10/02/18 0600 10/03/18 0500  Weight: 132.2 kg 132.2 kg 133.3 kg    Examination:  General 78 year old black female she is resting in bed she still has significant cough.  She is on 70% heated high flow nasal cannula however pulse oximetry is 98% so there is certainly room to wean HEENT normocephalic mucous membranes moist she has a right internal jugular vein triple-lumen catheter and a left internal jugular vein Swan-Ganz catheter Pulmonary: Diminished bases no accessory use Cardiac: Currently regular rate and rhythm, did have brief bursts of atrial fibrillation Abdomen: Soft nontender no pain tolerating diet Extremities warm dry dependent edema  Resolved Hospital Problem list    Assessment & Plan:   Acute hypoxic respiratory failure-favoring pulmonary edema and atx Portable chest x-ray personally reviewed.  This continues to show bilateral pulmonary edema aeration looks a little better but this is probably secondary to better inspiratory view Plan Wean oxygen as able Maximize hemodynamics Follow-up autoimmune panel Mobilize  Cardiogenic shock with severe right ventricular failure and diastolic heart failure Severe pulmonary  Hypertension -Suspect she has been chronically hypoxic Plan Continue  midodrine Cont NE and wean for MAP > 65 Continuing low-dose digoxin Cardiology will determine possible adding spironolactone in a.m.  Prolonged QTc, nonspecific ST changes and right bundle blanch block with mildly elevated high-sensitivity troponin - concern for STEMI, appreciate cardiology evaluation who did not feel EKG consistant for acute STE, noted to have RBBB and nonspecific ST changes, unclear previous EKG  Plan Avoid QTC prolonging agents Continue telemetry  AKI - unclear baseline sCr; renal ultrasound was negative.  Creatinine has normalized with optimization of cardiac output  plan Continue to renally adjust medications Maximize cardiac output Strict intake output Daily assessment for diuretics  Fluid and electrolyte imbalance: Hypoosmolar hyponatremia: Secondary to reduced cardiac output, sodium stable at 131; pulmonary capillary wedge pressure suggests euvolemia at this point plan Keep her euvolemic Daily assessment for diuresis Optimize hemodynamics A.m. chemistry    Best practice:  Diet: NPO Pain/Anxiety/Delirium protocol (if indicated): n/a VAP protocol (if indicated): n/a DVT prophylaxis: heparin SQ GI prophylaxis: PPI Glucose control: CBG q 4, add SSI if > 180 Mobility: progress as able Code Status: full  Family Communication: pending Disposition: ICU   She continues to be critically ill requiring high flow oxygen and close adjustment of her vasoactive and inotropic support.  Hopefully with further augmentation of her hemodynamics we can continue to mobilize fluid and improve her oxygenation.  Critical care x32 minutes This included time directly at the bedside  Simonne Martinet ACNP-BC [facility] Pulmonary/Critical Care Pager # [phone] OR # [phone] if no answer

## 2018-10-03 NOTE — Care Management (Signed)
CM consult acknowledged. CSW following for SNF needs.  Midge Minium RN, BSN, NCM-BC, ACM-RN [phone]

## 2018-10-03 NOTE — Progress Notes (Signed)
Advanced Heart Failure Rounding Note   Subjective:    Says she feels better this morning. Still weak but less SOB. No orthopnea or PND. No CP. Weight up 2 pounds. NE down to 7.   Remains on 70% HFNC Sats 95-97%  Swan numbers this am - done personally CVP 10 PA 61/46 (53) PCWP 13  Thermo 5.6/2.4  PVR = 7.1 WU  Co-ox 63%   Objective:   Weight Range:  Vital Signs:   Temp:  [98.1 F (36.7 C)-99.9 F (37.7 C)] 98.4 F (36.9 C) (09/28 0700) Resp:  [16-27] 24 (09/28 0700) BP: (98-155)/(43-100) 104/88 (09/28 0700) SpO2:  [73 %-100 %] 97 % (09/28 0700) FiO2 (%):  [65 %-70 %] 70 % (09/28 0415) Weight:  [133.3 kg] 133.3 kg (09/28 0500) Last BM Date: (PTA)  Weight change: Filed Weights   10/01/18 0500 10/02/18 0600 10/03/18 0500  Weight: 132.2 kg 132.2 kg 133.3 kg    Intake/Output:   Intake/Output Summary (Last 24 hours) at 10/03/2018 0742 Last data filed at 10/03/2018 0600 Gross per 24 hour  Intake 663.03 ml  Output 1600 ml  Net -936.97 ml     Physical Exam: General:  Obese Elderly woman lying in bed. Weak appearing. . No resp difficulty HEENT: normal Neck: supple. RIJ TLC LIJ swanCarotids 2+ bilat; no bruits. No lymphadenopathy or thryomegaly appreciated. Cor: PMI nondisplaced. Regular rate & rhythm. 2/6 TR prominent p2 Lungs: clear Abdomen: obese soft, nontender, nondistended. No hepatosplenomegaly. No bruits or masses. Good bowel sounds. Extremities: no cyanosis, clubbing, rash, edema Neuro: alert & orientedx3, cranial nerves grossly intact. moves all 4 extremities w/o difficulty. Affect pleasant  Telemetry: Sinus 90-100 Personally reviewed   Labs: Basic Metabolic Panel: Recent Labs  Lab 09/30/18 2327 10/01/18 0237 10/01/18 0923 10/01/18 1629 10/02/18 1026 10/02/18 1714  NA 126* 124* 125* 128* 132* 131*  K 4.0 4.1 3.7 3.6 3.2* 3.6  CL 76* 79* 79* 83* 88* 87*  CO2 40* 31 34* 30 29 32  GLUCOSE 118* 109* 110* 113* 109* 143*  BUN 46* 47* 43* 37*  24* 23  CREATININE 2.67* 2.49* 1.97* 1.55* 0.94 0.83  CALCIUM 8.2* 7.9* 7.8* 7.8* 8.0* 8.0*  MG 1.9  --   --   --   --   --   PHOS 3.0  --   --   --   --   --     Liver Function Tests: Recent Labs  Lab 09/30/18 2327 10/02/18 1026  AST 21 16  ALT 19 16  ALKPHOS 48 36*  BILITOT 0.6 1.2  PROT 7.5 6.4*  ALBUMIN 2.3* 2.1*   No results for input(s): LIPASE, AMYLASE in the last 168 hours. No results for input(s): AMMONIA in the last 168 hours.  CBC: Recent Labs  Lab 09/30/18 2324 09/30/18 2327 10/01/18 0237 10/02/18 0513 10/03/18 0519  WBC  --  9.3 8.8 12.8* 10.8*  NEUTROABS  --  5.0  --   --   --   HGB 11.6* 11.7* 11.1* 10.7* 10.4*  HCT 34.0* 35.8* 33.5* 32.2* 31.4*  MCV  --  85.4 84.4 84.3 84.9  PLT  --  408* 394 389 309    Cardiac Enzymes: No results for input(s): CKTOTAL, CKMB, CKMBINDEX, TROPONINI in the last 168 hours.  BNP: BNP (last 3 results) Recent Labs    09/30/18 2327  BNP 974.3*    ProBNP (last 3 results) No results for input(s): PROBNP in the last 8760 hours.  Other results:  Imaging: Nm Pulmonary Perfusion  Result Date: 10/01/2018 CLINICAL DATA:  Acute on chronic respiratory failure with hypoxemia. EXAM: NUCLEAR MEDICINE PERFUSION LUNG SCAN TECHNIQUE: Perfusion images were obtained in multiple projections after intravenous injection of radiopharmaceutical. Ventilation scans intentionally deferred if perfusion scan and chest x-ray adequate for interpretation during COVID 19 epidemic. RADIOPHARMACEUTICALS:  1.59 mCi Tc-35m MAA IV COMPARISON:  Chest x-ray 10/01/2018 FINDINGS: No segmental or subsegmental perfusion defects suspicious for pulmonary embolism. IMPRESSION: Negative perfusion scan for pulmonary embolism. Electronically Signed   By: Rudie Meyer M.D.   On: 10/01/2018 13:17   US Renal  Result Date: 10/01/2018 CLINICAL DATA:  78 year old female with acute kidney injury EXAM: RENAL / URINARY TRACT ULTRASOUND COMPLETE COMPARISON:  None.  FINDINGS: Right Kidney: Length: 8.5 cm x 5.0 cm x 4.0 cm, 87 cc. No hydronephrosis. Unremarkable echogenicity. Flow confirmed in the hilum. Left Kidney: Length: 9.7 cm x 4.4 cm x 5.0 cm, 105 cc. No hydronephrosis. Unremarkable echogenicity. Flow confirmed in the hilum of the left kidney. Bladder: Foley catheter in position. IMPRESSION: No evidence of hydronephrosis. Electronically Signed   By: Gilmer Mor D.O.   On: 10/01/2018 12:41   Dg Chest Port 1 View  Result Date: 10/02/2018 CLINICAL DATA:  Central line placement EXAM: PORTABLE CHEST 1 VIEW COMPARISON:  October 02, 2018 FINDINGS: A right central line terminates in the central SVC, stable. A PA catheter terminates in the right lower lobe branch of the right pulmonary artery. No pneumothorax. Bibasilar opacities are stable. No other acute interval changes. IMPRESSION: 1. The PA catheter terminates in the lower lobe branch of the right pulmonary artery. No pneumothorax. Stable bibasilar opacities. Electronically Signed   By: Gerome Sam III M.D   On: 10/02/2018 15:29   Dg Chest Port 1 View  Result Date: 10/02/2018 CLINICAL DATA:  Shortness of breath. EXAM: PORTABLE CHEST 1 VIEW COMPARISON:  Radiograph of October 01, 2018. FINDINGS: Stable cardiomediastinal silhouette. Right internal jugular catheter is unchanged. No pneumothorax is noted. Stable bibasilar opacities are noted concerning for pneumonia or atelectasis. No significant pleural effusion is noted. Bony thorax is unremarkable. IMPRESSION: Stable bibasilar opacities as described above. Electronically Signed   By: Lupita Raider M.D.   On: 10/02/2018 09:26   Vas [place] Lower Extremity Venous (dvt)  Result Date: 10/02/2018  Lower Venous Study Indications: Edema, Pain, and SOB.  Limitations: Body habitus and patient intolerant to compression. Comparison Study: No prior study Performing Technologist: Sherren Kerns RVS  Examination Guidelines: A complete evaluation includes B-mode imaging,  spectral Doppler, color Doppler, and power Doppler as needed of all accessible portions of each vessel. Bilateral testing is considered an integral part of a complete examination. Limited examinations for reoccurring indications may be performed as noted.  +---------+---------------+---------+-----------+----------+-------------------+ RIGHT    CompressibilityPhasicitySpontaneityPropertiesThrombus Aging      +---------+---------------+---------+-----------+----------+-------------------+ CFV                     Yes      Yes                  patent by color and                                                       Doppler             +---------+---------------+---------+-----------+----------+-------------------+  SFJ                     Yes      Yes                  patent by color and                                                       Doppler             +---------+---------------+---------+-----------+----------+-------------------+ FV Prox  Full                                                             +---------+---------------+---------+-----------+----------+-------------------+ FV Mid                  Yes      Yes                  patent by color and                                                       Doppler             +---------+---------------+---------+-----------+----------+-------------------+ FV Distal               Yes      Yes                  patent by color and                                                       Doppler             +---------+---------------+---------+-----------+----------+-------------------+ PFV                                                   Not visualized      +---------+---------------+---------+-----------+----------+-------------------+ POP                                                   patent by color and                                                       Doppler              +---------+---------------+---------+-----------+----------+-------------------+ PTV  Not visualized      +---------+---------------+---------+-----------+----------+-------------------+ PERO                                                  Not visualized      +---------+---------------+---------+-----------+----------+-------------------+   Right Technical Findings: Not visualized segments include posterior tibial and peroneal veins.  +---------+---------------+---------+-----------+----------+-------------------+ LEFT     CompressibilityPhasicitySpontaneityPropertiesThrombus Aging      +---------+---------------+---------+-----------+----------+-------------------+ CFV                     Yes      Yes                  patent by color and                                                       Doppler             +---------+---------------+---------+-----------+----------+-------------------+ SFJ                                                   patent by color and                                                       Doppler             +---------+---------------+---------+-----------+----------+-------------------+ FV Prox                                               patent by color and                                                       Doppler             +---------+---------------+---------+-----------+----------+-------------------+ FV Mid                                                patent by color and                                                       Doppler             +---------+---------------+---------+-----------+----------+-------------------+ FV Distal  patent by color and                                                       Doppler              +---------+---------------+---------+-----------+----------+-------------------+ PFV                                                   Not visualized      +---------+---------------+---------+-----------+----------+-------------------+ POP                                                   patent by color and                                                       Doppler             +---------+---------------+---------+-----------+----------+-------------------+ PTV                                                   Not visualized      +---------+---------------+---------+-----------+----------+-------------------+ PERO                                                  Not visualized      +---------+---------------+---------+-----------+----------+-------------------+   Left Technical Findings: Not visualized segments include posterior tibial and peroneal veins.   Summary: Right: There is no evidence of deep vein thrombosis in the lower extremity. However, portions of this examination were limited- see technologist comments above. Left: There is no evidence of deep vein thrombosis in the lower extremity. However, portions of this examination were limited- see technologist comments above.  *See table(s) above for measurements and observations. Electronically signed by Curt Jews MD on 10/02/2018 at 9:49:17 AM.    Final       Medications:     Scheduled Medications: . chlorhexidine  15 mL Mouth Rinse BID  . Chlorhexidine Gluconate Cloth  6 each Topical Daily  . heparin injection (subcutaneous)  5,000 Units Subcutaneous Q8H  . influenza vaccine adjuvanted  0.5 mL Intramuscular Tomorrow-1000  . mouth rinse  15 mL Mouth Rinse q12n4p  . midodrine  10 mg Oral TID WC  . pantoprazole  40 mg Oral Q1200  . sodium chloride flush  10-40 mL Intracatheter Q12H     Infusions: . sodium chloride Stopped (10/02/18 1402)  . norepinephrine (LEVOPHED) Adult infusion 7 mcg/min (10/03/18  0600)  . sodium chloride       PRN Medications:  albuterol, sodium chloride flush   Assessment/Plan:    . Acute respiratory failure - suspect multifactorial  due to undiagnosed/end-stage OSA/OHS and diastolic HF. That said I think chronic hypoxemia and end-stage OHS predominate (see PH discussion below) - ABG 7.46/56/46/83% + bicarb 40 on admit consistent with chronic CO2 retention - denies h/o tobacco use.  - VQ negative 9/26 - remains on 70% HFNC -> O2 wean per CCM - may need home bipap +/- trach - aggressive pulmonary toilet with IS and flutter valve  2. Pulmonary hypertension with RV failure.  - echo 9/26 Normal LV + significant RV strain with RVPS 81mmHG - I repeated Swan numbers myself today and pressures higher than yesterday with lower wedge giving a much higher PVR - suspect Who group II and III (diastolic HF and OHS) with chronic hypoxemia and end-stage OHS predominating - With elevated PVR may have component of WHO Group I and be worth a trial of sildenafil if BP improves and remain hypoxemia - add low-dose digoxin - will send Rheum serologies for completeness sake  3. Hypotension/shock - likely due to volume depletion and RV failure.  - ? Possible amyloid - Continue to wean NE as tolerated. Continue midodrine  4. Acute on chronic diastolic HF - management as above - consider PYP scan - Volume status ok currently with PCWP 13-14 range. Hold off on further diuresis for now.  - Consider spiro in am.   5. AKI - resolved with IVF & holding diuretics  6. Hyponatremia - due to HF and diuresis -  Improving. Can use tolvaptan as needed  7. Morbid obesity with severe deconditioning - desperately needs weight loss.  - PT/OT to see  8. Hypokalemia - supp. Start spiro as tolerated.   CRITICAL CARE Performed by: Arvilla MeresBensimhon, Namira Rosekrans  Total critical care time: 45 minutes  Critical care time was exclusive of separately billable procedures and treating other  patients.  Critical care was necessary to treat or prevent imminent or life-threatening deterioration.  Critical care was time spent personally by me (independent of midlevel providers or residents) on the following activities: development of treatment plan with patient and/or surrogate as well as nursing, discussions with consultants, evaluation of patient's response to treatment, examination of patient, obtaining history from patient or surrogate, ordering and performing treatments and interventions, ordering and review of laboratory studies, ordering and review of radiographic studies, pulse oximetry and re-evaluation of patient's condition.    Length of Stay: 3   Arvilla Meresaniel Jasson Siegmann MD 10/03/2018, 7:42 AM  Advanced Heart Failure Team Pager [phone] (M-F; 7a - 4p)  Please contact [facility] for night-coverage after hours (4p -7a ) and weekends on [email or web address]

## 2018-10-04 ENCOUNTER — Inpatient Hospital Stay (HOSPITAL_COMMUNITY): Payer: Medicare Other

## 2018-10-04 DIAGNOSIS — J9600 Acute respiratory failure, unspecified whether with hypoxia or hypercapnia: Secondary | ICD-10-CM

## 2018-10-04 DIAGNOSIS — I272 Pulmonary hypertension, unspecified: Secondary | ICD-10-CM

## 2018-10-04 DIAGNOSIS — I5030 Unspecified diastolic (congestive) heart failure: Secondary | ICD-10-CM | POA: Diagnosis present

## 2018-10-04 DIAGNOSIS — R570 Cardiogenic shock: Secondary | ICD-10-CM

## 2018-10-04 LAB — CBC
HCT: 30 % — ABNORMAL LOW (ref 36.0–46.0)
Hemoglobin: 10 g/dL — ABNORMAL LOW (ref 12.0–15.0)
MCH: 28.2 pg (ref 26.0–34.0)
MCHC: 33.3 g/dL (ref 30.0–36.0)
MCV: 84.7 fL (ref 80.0–100.0)
Platelets: 232 10*3/uL (ref 150–400)
RBC: 3.54 MIL/uL — ABNORMAL LOW (ref 3.87–5.11)
RDW: 14.6 % (ref 11.5–15.5)
WBC: 10.1 10*3/uL (ref 4.0–10.5)
nRBC: 0.2 % (ref 0.0–0.2)

## 2018-10-04 LAB — MPO/PR-3 (ANCA) ANTIBODIES
ANCA Proteinase 3: <3.5 U/mL (ref 0.0–3.5)
Myeloperoxidase Abs: <9 U/mL (ref 0.0–9.0)

## 2018-10-04 LAB — ENA+DNA/DS+ANTICH+CENTRO+JO...
Anti JO-1: <0.2 AI (ref 0.0–0.9)
Centromere Ab Screen: <0.2 AI (ref 0.0–0.9)
Chromatin Ab SerPl-aCnc: <0.2 AI (ref 0.0–0.9)
ENA SM Ab Ser-aCnc: <0.2 AI (ref 0.0–0.9)
Ribonucleic Protein: 1 AI — ABNORMAL HIGH (ref 0.0–0.9)
SSA (Ro) (ENA) Antibody, IgG: <0.2 AI (ref 0.0–0.9)
SSB (La) (ENA) Antibody, IgG: <0.2 AI (ref 0.0–0.9)
Scleroderma (Scl-70) (ENA) Antibody, IgG: 2 AI — ABNORMAL HIGH (ref 0.0–0.9)
ds DNA Ab: <1 IU/mL (ref 0–9)

## 2018-10-04 LAB — HEPATITIS PANEL, ACUTE
HCV Ab: <0.1 s/co ratio (ref 0.0–0.9)
Hep A IgM: NEGATIVE
Hep B C IgM: NEGATIVE
Hepatitis B Surface Ag: NEGATIVE

## 2018-10-04 LAB — BASIC METABOLIC PANEL
Anion gap: 11 (ref 5–15)
BUN: 17 mg/dL (ref 8–23)
CO2: 34 mmol/L — ABNORMAL HIGH (ref 22–32)
Calcium: 8 mg/dL — ABNORMAL LOW (ref 8.9–10.3)
Chloride: 88 mmol/L — ABNORMAL LOW (ref 98–111)
Creatinine, Ser: 0.61 mg/dL (ref 0.44–1.00)
GFR calc Af Amer: >60 mL/min (ref >60)
GFR calc non Af Amer: >60 mL/min (ref >60)
Glucose, Bld: 106 mg/dL — ABNORMAL HIGH (ref 70–99)
Potassium: 3.3 mmol/L — ABNORMAL LOW (ref 3.5–5.1)
Sodium: 133 mmol/L — ABNORMAL LOW (ref 135–145)

## 2018-10-04 LAB — CYCLIC CITRUL PEPTIDE ANTIBODY, IGG/IGA: CCP Antibodies IgG/IgA: 5 units (ref 0–19)

## 2018-10-04 LAB — RHEUMATOID FACTOR: Rheumatoid fact SerPl-aCnc: 10.5 IU/mL (ref 0.0–13.9)

## 2018-10-04 LAB — ANA W/REFLEX IF POSITIVE: Anti Nuclear Antibody (ANA): POSITIVE — AB

## 2018-10-04 LAB — ANTI-SCLERODERMA ANTIBODY: Scleroderma (Scl-70) (ENA) Antibody, IgG: 1.9 AI — ABNORMAL HIGH (ref 0.0–0.9)

## 2018-10-04 MED ORDER — TRAMADOL HCL 50 MG PO TABS
50.0000 mg | ORAL_TABLET | Freq: Four times a day (QID) | ORAL | Status: DC | PRN
  Administered 2018-10-04 – 2018-10-05 (×5): 50 mg via ORAL
  Filled 2018-10-04 (×5): qty 1

## 2018-10-04 MED ORDER — ONDANSETRON HCL 4 MG/2ML IJ SOLN: 4.0000 mg | Freq: Four times a day (QID) | INTRAMUSCULAR | Status: DC | PRN

## 2018-10-04 MED ORDER — ALBUMIN HUMAN 5 % IV SOLN
12.5000 g | Freq: Once | INTRAVENOUS | Status: AC
  Administered 2018-10-04: 22:00:00 12.5 g via INTRAVENOUS
  Filled 2018-10-04: qty 250

## 2018-10-04 MED ORDER — FUROSEMIDE 10 MG/ML IJ SOLN
40.0000 mg | Freq: Two times a day (BID) | INTRAMUSCULAR | Status: DC
  Administered 2018-10-04: 09:00:00 40 mg via INTRAVENOUS
  Filled 2018-10-04: qty 4

## 2018-10-04 MED ORDER — SPIRONOLACTONE 12.5 MG HALF TABLET
12.5000 mg | ORAL_TABLET | Freq: Every day | ORAL | Status: DC
  Administered 2018-10-04 – 2018-10-05 (×2): 12.5 mg via ORAL
  Filled 2018-10-04 (×2): qty 1

## 2018-10-04 MED ORDER — BUDESONIDE 0.25 MG/2ML IN SUSP
0.2500 mg | Freq: Two times a day (BID) | RESPIRATORY_TRACT | Status: DC
  Administered 2018-10-04 – 2018-10-05 (×4): 0.25 mg via RESPIRATORY_TRACT
  Filled 2018-10-04 (×4): qty 2

## 2018-10-04 MED ORDER — POTASSIUM CHLORIDE CRYS ER 20 MEQ PO TBCR
40.0000 meq | EXTENDED_RELEASE_TABLET | Freq: Once | ORAL | Status: AC
  Administered 2018-10-04: 12:00:00 40 meq via ORAL
  Filled 2018-10-04: qty 2

## 2018-10-04 MED ORDER — ARFORMOTEROL TARTRATE 15 MCG/2ML IN NEBU
15.0000 ug | INHALATION_SOLUTION | Freq: Two times a day (BID) | RESPIRATORY_TRACT | Status: DC
  Administered 2018-10-04 – 2018-10-05 (×4): 15 ug via RESPIRATORY_TRACT
  Filled 2018-10-04 (×4): qty 2

## 2018-10-04 MED ORDER — MONTELUKAST SODIUM 10 MG PO TABS
10.0000 mg | ORAL_TABLET | Freq: Every day | ORAL | Status: DC
  Administered 2018-10-04 – 2018-10-05 (×2): 10 mg via ORAL
  Filled 2018-10-04 (×2): qty 1

## 2018-10-04 MED ORDER — POTASSIUM CHLORIDE CRYS ER 20 MEQ PO TBCR
40.0000 meq | EXTENDED_RELEASE_TABLET | Freq: Once | ORAL | Status: AC
  Administered 2018-10-04: 07:00:00 40 meq via ORAL
  Filled 2018-10-04: qty 2

## 2018-10-04 MED ORDER — SILDENAFIL CITRATE 20 MG PO TABS
20.0000 mg | ORAL_TABLET | Freq: Three times a day (TID) | ORAL | Status: DC
  Administered 2018-10-04 (×3): 20 mg via ORAL
  Filled 2018-10-04 (×3): qty 1

## 2018-10-04 NOTE — Progress Notes (Addendum)
[place] Progress Note Patient Name: Autumn Brady DOB: Apr 19, 1940 MRN: [id number]   Date of Service  10/04/2018  HPI/Events of Note  Notified of oliguria. 75 cc this shift with no significant urine on bladder scan. Patient not in distress. Now off norepinephrine. CVP 8-10.  eICU Interventions  Will continue to monitor for now If BP remains stable in am and still oliguric consider diuresis Awaiting labs     Intervention Category Intermediate Interventions: Oliguria - evaluation and management  Judd Lien 10/04/2018, 3:07 AM

## 2018-10-04 NOTE — Progress Notes (Signed)
eLink Physician-Brief Progress Note Patient Name: Autumn Brady DOB: [birth year] MRN: [id number]   Date of Service  10/04/2018  HPI/Events of Note  Notified of K 3.3 Patient remains oliguric Creatinine 0.61 from 0.83 Off norepinephrine > 12 hours  eICU Interventions  Ordered K 40 meqs PO Will defer to bedside MD regarding diuresis     Intervention Category Major Interventions: Electrolyte abnormality - evaluation and management Intermediate Interventions: Oliguria - evaluation and management  Judd Lien 10/04/2018, 6:38 AM

## 2018-10-04 NOTE — Progress Notes (Addendum)
NAME:  Autumn Brady, MRN:  [id number], DOB:  [birth year], LOS: 4 ADMISSION DATE:  09/30/2018, CONSULTATION DATE:  09/30/2018 REFERRING MD:  [facility], CHIEF COMPLAINT:  Hypoxic respiratory failure  Brief History   32 yof transferred from [facility], originally admitted for hypoxic respiratory failure being treated there for HCAP and heart failure with progressive AKI with diuresis and hypoxia requiring transferred to [facility] higher level of care.   Past Medical History  Respiratory failure, HTN, syncope, bradycardia, ?HF  Significant Hospital Events   9/16 tx to [facility] from [facility] 9/25 Admit to [facility]; given IVFs for presumed post-diuresis hypotension, qtc prolonged so zofran and levaquin stopped. started on IV heparin for elevated DDimer and high risk for PE (cr prevented assessment via CT-A) 9/26 getting ECHO/VQ and LE dopplers. Na dropping w/ balanced crystalloid resuscitation. 9/27: Sodium and creatinine improving.  Still on high flow oxygen but x-ray perhaps a little better physically looks better.  Still requiring nonrebreather.  PA catheter placed showing low normal filling pressures diuresis held off continuing norepinephrine heart failure keep close welted 9/28 still on high flow oxygen weaning norepinephrine demonstrating significant pulmonary hypertension  Consults:  Cardiology  Procedures:  Right IJ CVL 9/26>>> Left IJ Swan-Ganz catheter 9/27 Significant Diagnostic Tests:  Echocardiogram 9/26: Hyperdynamic left ventricle with EF 70 to 75% and severe LV septal wall thickness.  Severe LVH.  LV diastolic filling dysfunction noted.  Global RV systolic function normal however the RV was severely enlarged.  RV severely dilated consistent with RV overload.  Estimated pulmonary artery systolic pressure of 81 mmHg, assuming a right atrial pressure 15 mmHg Renal ultrasound 9/26: Negative Nuclear medicine perfusion study 9/26-negative  Micro Data:  OSH COVID 9/17 >> neg  9/26 SARS CoV 2  9/26 BCx2 >>NG 9/26 UC >>NG  Antimicrobials:  None  Interim history/subjective:  Feels maybe slightly better today, down to 50% on HFNC 30L.  Oliguric.  Objective    FiO2 (%):  [50 %-70 %] 50 % Blood pressure (!) 106/49, pulse 93, temperature 98.1 F (36.7 C), resp. rate 19, height 5\' 6"  (1.676 m), weight 131.4 kg, SpO2 (!) 84 %. PAP: (43-70)/(37-65) 56/50 CVP:  [8 mmHg-14 mmHg] 10 mmHg PCWP:  [14 mmHg] 14 mmHg CO:  [4.8 L/min-5 L/min] 5 L/min CI:  [2 L/min/m2-2.1 L/min/m2] 2.1 L/min/m2  FiO2 (%):  [50 %-70 %] 50 %   Intake/Output Summary (Last 24 hours) at 10/04/2018 0738 Last data filed at 10/04/2018 0700 Gross per 24 hour  Intake 558.57 ml  Output 580 ml  Net -21.43 ml   Filed Weights   10/02/18 0600 10/03/18 0500 10/04/18 0237  Weight: 132.2 kg 133.3 kg 131.4 kg    Examination: GEN: obese woman in NAD HEENT: MMM, central lines CDI BL IJ CV: RRR, ext warm PULM: Diminished at bases due to poor inspiratory effort GI: Soft, +BS EXT: Tender to touch (chronic), trace edema NEURO: Moves all 4 ext to command, globaly weak PSYCH: AOx3, fair insight SKIN: No rashes   Resolved Hospital Problem list    Assessment & Plan:  # Seems like RH failure with some peripheral inappropriate vascular tone superimposed improved # Hypoxemia related to heart failure, some passive congestion, and atelectasis from weight # Pulm HTN mostly groups 2/3 (HFPEF, OSA, OHS, chronic hypoxemia) # AKI improving with supportive care, multifactorial # Hypervolemic hyponatremia improving  - Off levophed today! Unable to get wedge due to [facility] positioning, maybe we can remove today if not working - Continue oxygen wean and  encourage IS (still cannot reach 500cc) - Continue midodrine - Start pulmicort/brovana nebs (on ICS/LABA at home, some reports of coughing fits yesterday) - Needs PT/OT/rehab - Fine for progressive transfer, will ask [facility] to take over care starting tomorrow  Best practice:   Diet: Cardiac Pain/Anxiety/Delirium protocol (if indicated): n/a VAP protocol (if indicated): n/a DVT prophylaxis: heparin SQ GI prophylaxis: PPI Glucose control: CBG q 4, add SSI if > 180 Mobility: progress as able Code Status: full  Family Communication: updated brother the other day, updated patient today Disposition: see above  Erskine Emery MD [facility] 10/04/2018 7:56 AM Personal pager: [phone] If unanswered, please page CCM On-call: [phone]

## 2018-10-04 NOTE — Progress Notes (Addendum)
Advanced Heart Failure Rounding Note   Subjective:    HFNC FIO2 40%  Flow Rate 35.   CVP 9  PCWP unable to wedge  Thermo 5/2.1   PVR = 7.1 WU  Co-ox 63%  Denies SOB. Remains lethargic.   Objective:   Weight Range:  Vital Signs:   Temp:  [98.1 F (36.7 C)-99.1 F (37.3 C)] 98.1 F (36.7 C) (09/29 0700) Pulse Rate:  [93-130] 93 (09/28 1537) Resp:  [13-28] 19 (09/29 0700) BP: (88-119)/(43-99) 106/49 (09/29 0700) SpO2:  [79 %-97 %] 84 % (09/29 0700) FiO2 (%):  [50 %-70 %] 50 % (09/29 0400) Weight:  [131.4 kg] 131.4 kg (09/29 0237) Last BM Date: 10/03/18  Weight change: Filed Weights   10/02/18 0600 10/03/18 0500 10/04/18 0237  Weight: 132.2 kg 133.3 kg 131.4 kg    Intake/Output:   Intake/Output Summary (Last 24 hours) at 10/04/2018 0745 Last data filed at 10/04/2018 0700 Gross per 24 hour  Intake 558.57 ml  Output 580 ml  Net -21.43 ml     Physical Exam: On HF [place]  CVP 9 General: No resp difficulty HEENT: normal Neck: supple. JVP 8-9. Carotids 2+ bilat; no bruits. No lymphadenopathy or thryomegaly appreciated. LIJ swan. RIJ  Cor: PMI nondisplaced. Regular rate & rhythm. No rubs, gallops.  2/6 TR Prominent P2 Lungs: clear Abdomen: soft, nontender, nondistended. No hepatosplenomegaly. No bruits or masses. Good bowel sounds. Extremities: no cyanosis, clubbing, rash, edema Neuro: alert & orientedx3, cranial nerves grossly intact. moves all 4 extremities w/o difficulty. Affect pleasant   Telemetry: SR 80-90s personally reviewed.    Labs: Basic Metabolic Panel: Recent Labs  Lab 09/30/18 2327  10/01/18 0923 10/01/18 1629 10/02/18 1026 10/02/18 1714 10/04/18 0500  NA 126*   < > 125* 128* 132* 131* 133*  K 4.0   < > 3.7 3.6 3.2* 3.6 3.3*  CL 76*   < > 79* 83* 88* 87* 88*  CO2 40*   < > 34* 30 29 32 34*  GLUCOSE 118*   < > 110* 113* 109* 143* 106*  BUN 46*   < > 43* 37* 24* 23 17  CREATININE 2.67*   < > 1.97* 1.55* 0.94 0.83 0.61  CALCIUM 8.2*   <  > 7.8* 7.8* 8.0* 8.0* 8.0*  MG 1.9  --   --   --   --   --   --   PHOS 3.0  --   --   --   --   --   --    < > = values in this interval not displayed.    Liver Function Tests: Recent Labs  Lab 09/30/18 2327 10/02/18 1026  AST 21 16  ALT 19 16  ALKPHOS 48 36*  BILITOT 0.6 1.2  PROT 7.5 6.4*  ALBUMIN 2.3* 2.1*   No results for input(s): LIPASE, AMYLASE in the last 168 hours. No results for input(s): AMMONIA in the last 168 hours.  CBC: Recent Labs  Lab 09/30/18 2327 10/01/18 0237 10/02/18 0513 10/03/18 0519 10/04/18 0500  WBC 9.3 8.8 12.8* 10.8* 10.1  NEUTROABS 5.0  --   --   --   --   HGB 11.7* 11.1* 10.7* 10.4* 10.0*  HCT 35.8* 33.5* 32.2* 31.4* 30.0*  MCV 85.4 84.4 84.3 84.9 84.7  PLT 408* 394 389 309 232    Cardiac Enzymes: No results for input(s): CKTOTAL, CKMB, CKMBINDEX, TROPONINI in the last 168 hours.  BNP: BNP (last 3 results) Recent  Labs    09/30/18 2327  BNP 974.3*    ProBNP (last 3 results) No results for input(s): PROBNP in the last 8760 hours.    Other results:  Imaging: Dg Chest Port 1 View  Result Date: 10/03/2018 CLINICAL DATA:  Shortness of breath EXAM: PORTABLE CHEST 1 VIEW COMPARISON:  10/02/2018 FINDINGS: Interval repositioning of a left internal jugular approach pulmonary arterial catheter with distal tip now terminating within the left pulmonary artery. Right IJ central venous catheter terminates at the level of the superior cavoatrial junction. Heart size is stable. Lung volumes are low. Bibasilar opacities, left greater than right. No large pleural fluid collection. No pneumothorax. IMPRESSION: 1. Left IJ PA catheter now terminates within the left pulmonary artery. No pneumothorax. 2. Persistent bibasilar opacities. Electronically Signed   By: Duanne Guess M.D.   On: 10/03/2018 11:35   Dg Chest Port 1 View  Result Date: 10/02/2018 CLINICAL DATA:  Central line placement EXAM: PORTABLE CHEST 1 VIEW COMPARISON:  October 02, 2018 FINDINGS: A right central line terminates in the central SVC, stable. A PA catheter terminates in the right lower lobe branch of the right pulmonary artery. No pneumothorax. Bibasilar opacities are stable. No other acute interval changes. IMPRESSION: 1. The PA catheter terminates in the lower lobe branch of the right pulmonary artery. No pneumothorax. Stable bibasilar opacities. Electronically Signed   By: Gerome Sam III M.D   On: 10/02/2018 15:29     Medications:     Scheduled Medications: . chlorhexidine  15 mL Mouth Rinse BID  . Chlorhexidine Gluconate Cloth  6 each Topical Daily  . digoxin  0.125 mg Oral Daily  . heparin injection (subcutaneous)  5,000 Units Subcutaneous Q8H  . influenza vaccine adjuvanted  0.5 mL Intramuscular Tomorrow-1000  . mouth rinse  15 mL Mouth Rinse q12n4p  . midodrine  10 mg Oral TID WC  . pantoprazole  40 mg Oral Q1200  . sodium chloride flush  10-40 mL Intracatheter Q12H    Infusions: . sodium chloride 10 mL/hr at 10/04/18 0700  . norepinephrine (LEVOPHED) Adult infusion 1 mcg/min (10/03/18 1700)  . sodium chloride      PRN Medications: acetaminophen, albuterol, benzonatate, sodium chloride flush   Assessment/Plan:    1. Acute respiratory failure - suspect multifactorial due to undiagnosed/end-stage OSA/OHS and diastolic HF. That said I think chronic hypoxemia and end-stage OHS predominate (see PH discussion below) - ABG 7.46/56/46/83% + bicarb 40 on admit consistent with chronic CO2 retention - denies h/o tobacco use.  - VQ negative 9/26 - remains on 40% HFNC -> O2 wean per CCM - may need home bipap +/- trach - aggressive pulmonary toilet with IS and flutter valve  2. Pulmonary hypertension with RV failure.  - echo 9/26 Normal LV + significant RV strain with RVPS - suspect Who group II and III (diastolic HF and OHS) with chronic hypoxemia and end-stage OHS predominating - With elevated PVR may have component of WHO Group  I and be worth a trial of sildenafil if BP improves and remain hypoxemia - Continue low-dose digoxin - will send Rheum serologies for completeness sake  3. Hypotension/shock - likely due to volume depletion and RV failure.  - ? Possible amyloid - Continue to wean NE as tolerated. Continue midodrine  4. Acute on chronic diastolic HF - management as above - consider PYP scan - CVP 9  - Consider spiro in am.   5. AKI - resolved with IVF & holding diuretics  6. Hyponatremia - due to HF and diuresis - Sodium up 133 -  7. Morbid obesity with severe deconditioning - desperately needs weight loss.  - PT/OT to see  8. Hypokalemia - K 3.3. Supp K.   Length of Stay: 4   Amy Clegg NP-C  10/04/2018, 7:45 AM  Advanced Heart Failure Team Pager [phone] (M-F; 7a - 4p)  Please contact [facility] for night-coverage after hours (4p -7a ) and weekends on [email or web address]  Agree with above.  Awake this am but falls asleep easily. Swan pulled back. Remains on HFNC but down to 40%. Denies SOB. Very weak. Ony able to pull 300 on IS. Renal function and serum sodium improved.SBP > 100 off NE. CVP 9. CO ok.   General:  Weak appearing Morbidly obese woman in bed. No resp difficulty HEENT: normal Neck: supple. no LIJ swan RIJ TLC. Carotids 2+ bilat; no bruits. No lymphadenopathy or thryomegaly appreciated. Cor: PMI nondisplaced. Regular rate & rhythm. 2/6 TR Lungs: clear Abdomen: obese soft, nontender, nondistended. No hepatosplenomegaly. No bruits or masses. Good bowel sounds. Extremities: no cyanosis, clubbing, rash, edema Neuro: alert & orientedx3, cranial nerves grossly intact. moves all 4 extremities w/o difficulty. Affect pleasant  Suspect she has end-stage OHS/OSA complicated by severe decondtioning, PAH and RV dysfunction in setting of chronic hypoxia. Likely with mild diastolic HF. Recent PCWP is low. She was not on lasix on home (just HCTZ)  Would avoid further loop diuretics  for now. Start spiro. Continue midodrine. Will give trial of low-dose sildenafil to see how she responds.   Wean O2. Suspect she will ned hoe O2 and CPAP vs BIPAP. Desperately needs Rehab.   Can pull Swan. D/w Dr. Katrinka BlazingSmith at bedside.   CRITICAL CARE Performed by: Arvilla MeresBensimhon, Quame Spratlin  Total critical care time: 35 minutes  Critical care time was exclusive of separately billable procedures and treating other patients.  Critical care was necessary to treat or prevent imminent or life-threatening deterioration.  Critical care was time spent personally by me (independent of midlevel providers or residents) on the following activities: development of treatment plan with patient and/or surrogate as well as nursing, discussions with consultants, evaluation of patient's response to treatment, examination of patient, obtaining history from patient or surrogate, ordering and performing treatments and interventions, ordering and review of laboratory studies, ordering and review of radiographic studies, pulse oximetry and re-evaluation of patient's condition.  Arvilla Meresaniel Masae Lukacs, MD  9:36 AM

## 2018-10-04 NOTE — Progress Notes (Signed)
PT Cancellation Note  Patient Details Name: Autumn Brady MRN: [id number] DOB: March 18, 1940   Cancelled Treatment:    Reason Eval/Treat Not Completed: PT screened, no needs identified, will sign off.  Pt has been being lifted by hoyer lift at baseline.  Pt is not appropriate for PT. Nursing can use Maxi sky lift to get pt OOB as indicated.     Denice Paradise 10/04/2018, 8:36 AM [facility] Pager:  [phone]  Office:  [phone]

## 2018-10-04 NOTE — Progress Notes (Signed)
eLink Physician-Brief Progress Note Patient Name: Autumn Brady DOB: 1940-11-30 MRN: [id number]   Date of Service  10/04/2018  HPI/Events of Note  RN requests  Patient transfer out of the ICU but Pt remains hypotensive with MAP's below 60 mmHg, which represents a decline from yesterday.  eICU Interventions  Transfer order discontinued. Albumin 5 % 250 ml iv bolus x 1 ordered,  Norepinephrine is to be restarted if MAPS remain below 60 mmHg following Albumin bolus.        Kerry Kass Shy Guallpa 10/04/2018, 10:08 PM

## 2018-10-05 DIAGNOSIS — I5031 Acute diastolic (congestive) heart failure: Secondary | ICD-10-CM

## 2018-10-05 DIAGNOSIS — I5030 Unspecified diastolic (congestive) heart failure: Secondary | ICD-10-CM

## 2018-10-05 LAB — BASIC METABOLIC PANEL
Anion gap: 7 (ref 5–15)
BUN: 17 mg/dL (ref 8–23)
CO2: 32 mmol/L (ref 22–32)
Calcium: 7.9 mg/dL — ABNORMAL LOW (ref 8.9–10.3)
Chloride: 94 mmol/L — ABNORMAL LOW (ref 98–111)
Creatinine, Ser: 0.64 mg/dL (ref 0.44–1.00)
GFR calc Af Amer: >60 mL/min (ref >60)
GFR calc non Af Amer: >60 mL/min (ref >60)
Glucose, Bld: 105 mg/dL — ABNORMAL HIGH (ref 70–99)
Potassium: 4.3 mmol/L (ref 3.5–5.1)
Sodium: 133 mmol/L — ABNORMAL LOW (ref 135–145)

## 2018-10-05 LAB — CBC
HCT: 31.3 % — ABNORMAL LOW (ref 36.0–46.0)
Hemoglobin: 10.1 g/dL — ABNORMAL LOW (ref 12.0–15.0)
MCH: 28.1 pg (ref 26.0–34.0)
MCHC: 32.3 g/dL (ref 30.0–36.0)
MCV: 86.9 fL (ref 80.0–100.0)
Platelets: 242 10*3/uL (ref 150–400)
RBC: 3.6 MIL/uL — ABNORMAL LOW (ref 3.87–5.11)
RDW: 15.1 % (ref 11.5–15.5)
WBC: 10.4 10*3/uL (ref 4.0–10.5)
nRBC: 0 % (ref 0.0–0.2)

## 2018-10-05 LAB — ANCA TITERS
Atypical P-ANCA titer: 1:80 {titer} — ABNORMAL HIGH
C-ANCA: <1:20 {titer}
P-ANCA: <1:20 {titer}

## 2018-10-05 LAB — GLUCOSE, CAPILLARY: Glucose-Capillary: 115 mg/dL — ABNORMAL HIGH (ref 70–99)

## 2018-10-05 LAB — MAGNESIUM: Magnesium: 1.2 mg/dL — ABNORMAL LOW (ref 1.7–2.4)

## 2018-10-05 MED ORDER — MAGNESIUM SULFATE 50 % IJ SOLN
6.0000 g | Freq: Once | INTRAVENOUS | Status: AC
  Administered 2018-10-05: 08:00:00 6 g via INTRAVENOUS
  Filled 2018-10-05: qty 12

## 2018-10-05 MED ORDER — DIGOXIN 125 MCG PO TABS: 0.1250 mg | ORAL_TABLET | Freq: Every day | ORAL | Status: AC

## 2018-10-05 MED ORDER — BENZONATATE 100 MG PO CAPS: 100.0000 mg | ORAL_CAPSULE | Freq: Three times a day (TID) | ORAL | 0 refills | Status: AC | PRN

## 2018-10-05 MED ORDER — ARFORMOTEROL TARTRATE 15 MCG/2ML IN NEBU: 15.0000 ug | INHALATION_SOLUTION | Freq: Two times a day (BID) | RESPIRATORY_TRACT | Status: AC

## 2018-10-05 MED ORDER — PANTOPRAZOLE SODIUM 40 MG PO TBEC: 40.0000 mg | DELAYED_RELEASE_TABLET | Freq: Every day | ORAL | Status: AC

## 2018-10-05 MED ORDER — MONTELUKAST SODIUM 10 MG PO TABS: 10.0000 mg | ORAL_TABLET | Freq: Every day | ORAL | Status: AC

## 2018-10-05 MED ORDER — MIDODRINE HCL 10 MG PO TABS: 10.0000 mg | ORAL_TABLET | Freq: Three times a day (TID) | ORAL | Status: AC

## 2018-10-05 MED ORDER — BUDESONIDE 0.25 MG/2ML IN SUSP: 0.2500 mg | Freq: Two times a day (BID) | RESPIRATORY_TRACT | 12 refills | Status: AC

## 2018-10-05 MED ORDER — SPIRONOLACTONE 25 MG PO TABS: 12.5000 mg | ORAL_TABLET | Freq: Every day | ORAL | Status: AC

## 2018-10-05 MED ORDER — ACETAMINOPHEN 325 MG PO TABS: 650.0000 mg | ORAL_TABLET | Freq: Four times a day (QID) | ORAL | Status: AC | PRN

## 2018-10-05 MED ORDER — ALBUTEROL SULFATE (2.5 MG/3ML) 0.083% IN NEBU: 2.5000 mg | INHALATION_SOLUTION | RESPIRATORY_TRACT | 12 refills | Status: AC | PRN

## 2018-10-05 NOTE — Progress Notes (Addendum)
Patient noted with near syncopal event after insertion of indwelling urinary catheter. Patient required being laid flat and bed set in partial trendelenburg to assist with catheter insertion. Patient reports feeling as if she was going to pass out / dizzy near end of catheter insertion. Procedure completed without complication and patient set back up with [facility] raised to near 30 degrees. After [facility] raised patient noted with complete change in mental status. Patient noted with delayed responses and glazed eyes focusing on ceiling light. Patient starts reaching towards ceiling light and then becomes restless. Patient intermittently following commands but never completely lost consciousness. Patient able to mumble her first name during episode of altered mental status but would not look at nursing staff when directed to do so - she remained with a glazed stare at the ceiling light. Patients restlessness noted after procedure when phlebotomist attempted to collect ordered blood work; patient would not remain still which is totally opposite from her mental status prior of being keenly alert, oriented, calm and cooperative. Patient noted with hypotension after HOB raised as low as 46-80 systolic which slowly recovered to near baseline without intervention. Patient level of consciousness and mental status returned to baseline within approximately 5 mins of altered mentation starting.  Patient did not have brady event per telemetry review; however, patient noted to go into irregular rhythm throwing extra PVCs. On call MD notified and updated. Patients BP remained soft and labile post procedure. On call MD advised placement of central line and initiation of Levo infusion for MAP > 65. Orders received and carried out. Central line placed without complication; Levo infusion initiated as ordered.

## 2018-10-05 NOTE — Discharge Summary (Addendum)
Physician Discharge Summary         Patient ID: Autumn Brady MRN: [id number] DOB/AGE: 78-Mar-1942 78 y.o.  Admit date: 09/30/2018 Discharge date: 10/05/2018  Discharge Diagnoses:   Acute Hypoxic/ Hypercarbic Respiratory Failure Pulmonary hypertension with RV failure Hypotension/shock Acute on chronic diastolic HF AKI Hyponatremia Morbid obesity with severe deconditioning  Discharge summary    78 year old female with prior history of morbid obesity, hypertension, and bradycardia who was transferred from [place] on 9/25 for progressive hypoxia and AKI.   Originally admitted to [facility] after Labor Day after a fall, abdominal pain and diarrhea.  Hospitalization complicated by acute hypoxic respiratory failure and not able to be weaned off HFNC.  She was transferred to [facility] on 9/16.  There, she was treated for possible infectious etiology/ HCAP and being diuresed for heart failure with progressive AKI, hyponatremia and worsening hypoxia.    She was directly admited to ICU with PCCM admitting.  Once arrived, she was hypotensive with SBP in the 90's and requiring NRB, then transitioned to HFNC.  Labs noted for sCr 2.49, Na 124, EKG with NSR 80's with RBBB, CXR c/w low lung volumes.  Patient was afebrile with normal procalcitionin and WBC therefore monitored clinically off antibiotics with cultures sent.  Given her ongoing hypotension, she was given small fluid boluses.  A central line was placed and co-ox trended, initial 61%. She was started on norepinephine for ongoing blood pressure support and given concern for possible PE, empirically placed on heparin till VQ scan and LE doppler studies on 9/26 were negative and heparin discontinued.  TTE performed on 9/26 showed hyperdynamic LV with EF 70-75%, severely dilated RV, severe LVH with LV diastolic dysfunction, and RSVP 54mmHG.  Heart failure team was consulted on 9/27 and swan ganz catheter placed, hemodynamics as below.   Started on low dose digoxin 9/28.  Lasix given 9/27 and 9/29 to maintain euvolemic.  She was started on midodrine to help with hypotension and levophed off 9/29.  Culture data negative thus far.  A trial of sildenafil on started on 9/29 , however required brief pressors overnight, sildenafil discontinued and pressors off 9/30 am.  She remains on HFNC at 40% with recommendations for BiPAP at night for severe suspsected OSA.  Ongoing PT/ OT efforts.  Patient to be discharged to [facility] 9/30 for ongoing treatments and aggressive rehab.                                     Significant Hospital Events    9/16:  tx to [facility] from [facility] 9/25: Admit to [facility]; given IVFs for presumed post-diuresis hypotension, qtc prolonged so zofran and levaquin stopped. started on IV heparin for elevated DDimer and high risk for PE (cr prevented assessment via CT-A) 9/26 getting ECHO/VQ and LE dopplers. Na dropping w/ balanced crystalloid resuscitation. 9/27: Sodium and creatinine improving.  Still on 70% HFNC but x-ray perhaps a little better physically looks better.  PA catheter placed.  CVP 7 PA 55/35 (42) PCWP 22 Thermo 5.8/2.5 PVR 3.5 SVR 940 Diuresis held off continuing norepinephrine  9/28:  70% HFNC Sats 95-97% CVP 10 PA 61/46 (53) PCWP 13  Thermo 5.6/2.4  PVR = 7.1 WU Co-ox 63% 9/29:  HFNC FIO2 40%  Flow Rate 35.  CVP 9  PCWP unable to wedge  Thermo 5/2.1   PVR = 7.1 WU Co-ox  63% 9/30:  Restarted on low-dose levo overnight for hypotension. DC'd pressor this morning and stopped sildenafil.  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) - sildenafil d/c 9/30 secondary to hypotension - continue low dose digoxin  Hypotension/shock - resolved, off levophed 9/30 and sildenafil d/c'd - continue midodrine 10 mg TID - d/c CVL   Acute on chronic diastolic HF - currently euvolemic which is goal, continue spironolactone  AKI - resolved with IVF & holding diuretics - monitor BMP  Hyponatremia - secondary to HF and diuresis  - monitor BMP   Morbid obesity with severe deconditioning - strongly encourage weight loss - continue PT/ OT efforts   Significant Hospital tests/ studies  Echocardiogram 9/26: Hyperdynamic left ventricle with EF 70 to 75% and severe LV septal wall thickness.  Severe LVH.  LV diastolic filling dysfunction noted.  Global RV systolic function normal however the RV was severely enlarged.  RV severely dilated consistent with RV overload.  Estimated pulmonary artery systolic pressure of 81 mmHg, assuming a right atrial pressure 15 mmHg Renal ultrasound 9/26: Negative Nuclear medicine perfusion study 9/26-negative  Procedures   Right IJ CVL 9/26>>> 9/30 Left IJ Swan-Ganz catheter 9/27  Culture data/antimicrobials   OSH COVID 9/17 >> neg  9/25 SARS CoV 2 >>neg 9/25 MRSA PCR >> neg 9/26 BCx2 >>NGTD 9/26 UC >>neg   Consults  Heart failure team 9/27    Discharge Exam: BP 112/67   Pulse 74   Temp 97.7 F (36.5 C)   Resp (!) 23   Ht 5\' 6"  (1.676 m)   Wt 132.6 kg   LMP  (LMP Unknown)   SpO2 90%   BMI 47.18 kg/m   General:  Obese, elderly female sitting upright in bed in NAD HEENT: MM pink/moist, wearing glasses Neuro: Alert, oriented, MAE- generalized weakness CV: RR, no murmur PULM:  Non labored, on HFNC, lungs CTA GI: soft, bs active  Extremities: warm/dry, no obvious LE edema  Skin: no rashes  Labs at discharge    Lab Results  Component Value Date   CREATININE 0.64 10/05/2018   BUN 17 10/05/2018   NA 133 (L) 10/05/2018   K 4.3 10/05/2018   CL 94 (L) 10/05/2018   CO2 32 10/05/2018   Lab Results  Component Value Date   WBC 10.4 10/05/2018   HGB 10.1 (L) 10/05/2018   HCT 31.3 (L) 10/05/2018   MCV 86.9 10/05/2018   PLT 242 10/05/2018   Lab Results  Component Value Date   ALT 16 10/02/2018   AST 16 10/02/2018   ALKPHOS 36 (L) 10/02/2018   BILITOT 1.2 10/02/2018   No results found for: INR, PROTIME  Current radiological studies    Dg Chest Port 1 View  Result Date: 10/04/2018 CLINICAL DATA:  Shortness of breath EXAM: PORTABLE CHEST 1 VIEW COMPARISON:  10/03/2018 FINDINGS: Cardiac shadow is stable. Swan-Ganz catheter is again noted and stable. Right jugular central line is noted in  the superior right atrium. The overall inspiratory effort is again poor. Bibasilar atelectatic changes are noted left slightly greater than right. No sizable effusion is seen. IMPRESSION: Stable appearance of the chest from the prior exam. Electronically Signed   By: Alcide CleverMark  Lukens M.D.   On: 10/04/2018 08:26    Disposition:    Discharge disposition: [facility] Not Defined     [facility]     Allergies as of 10/05/2018      Reactions   Accuretic [quinapril-hydrochlorothiazide] Other (See Comments)   passed out   Codeine Anxiety   hyper   Biaxin [clarithromycin] Other (See Comments)   disoriented   Adhesive [tape] Other (See Comments)   Redness and tears skin, Please use "paper" tape   Latex Other (See Comments)   Bruising   Other Other (See Comments)   Anesthesia (pt not sure which one) made her jump off of the table   Vibramycin [doxycycline Calcium] Nausea And Vomiting      Medication List    STOP taking these medications   Advair Diskus 250-50 MCG/DOSE Aepb Generic drug: Fluticasone-Salmeterol   amLODipine 10 MG tablet Commonly known as: NORVASC    aspirin EC 81 MG tablet   Bystolic 10 MG tablet Generic drug: nebivolol   CALCIUM-D PO   cetirizine 10 MG tablet Commonly known as: ZYRTEC   losartan-hydrochlorothiazide 100-25 MG tablet Commonly known as: HYZAAR   raloxifene 60 MG tablet Commonly known as: EVISTA     TAKE these medications   acetaminophen 325 MG tablet Commonly known as: TYLENOL Take 2 tablets (650 mg total) by mouth every 6 (six) hours as needed for moderate pain.   albuterol (2.5 MG/3ML) 0.083% nebulizer solution Commonly known as: PROVENTIL Take 3 mLs (2.5 mg total) by nebulization every 4 (four) hours as needed for wheezing or shortness of breath.   arformoterol 15 MCG/2ML Nebu Commonly known as: BROVANA Take 2 mLs (15 mcg total) by nebulization 2 (two) times daily.   benzonatate 100 MG capsule Commonly known as: TESSALON Take 1 capsule (100 mg total) by mouth 3 (three) times daily as needed for cough.   budesonide 0.25 MG/2ML nebulizer solution Commonly known as: PULMICORT Take 2 mLs (0.25 mg total) by nebulization 2 (two) times daily.   digoxin 0.125 MG tablet Commonly known as: LANOXIN Take 1 tablet (0.125 mg total) by mouth daily. Start taking on: October 06, 2018   midodrine 10 MG tablet Commonly known as: PROAMATINE Take 1 tablet (10 mg total) by mouth 3 (three) times daily with meals. Start taking on: October 06, 2018   montelukast 10 MG tablet Commonly known as: SINGULAIR Take 1 tablet (10 mg total) by mouth at bedtime. What changed: when to take this   pantoprazole 40 MG tablet Commonly known as: PROTONIX Take 1 tablet (40 mg total) by mouth daily at 12 noon. Start taking on: October 06, 2018   spironolactone 25 MG tablet Commonly known as: ALDACTONE Take 0.5 tablets (12.5 mg total) by mouth daily. Start taking on: October 06, 2018        Follow-up appointment     F/u with heart failure clinic  Discharge Condition:    stable  I spent 50 minutes in direct patient  care including reviewing data,  discussing with other providers, assessment, planning and stabilization and documentation. Time is exclusive to this patient and does not include procedures.    Posey BoyerBrooke Simpson, MSN, AGACNP-BC [facility] Pgr: [phone] or  if no answer [phone] 10/05/2018, 5:42 PM   E-LINK Critical Care Physician Note: Patient is scheduled for transfer to LTAC tonight. Transfer is pending co-sign of previously completed discharge summary authored by Posey Boyer, NP. I have co-signed the discharge summary in order to facilitate LTAC transfer tonight.  Juanetta Snow, MD [facility]

## 2018-10-05 NOTE — Progress Notes (Addendum)
Advanced Heart Failure Rounding Note   Subjective:    HFNC FIO2 40%  Flow Rate 35.   Last night norepi 1 mcg restarted for hypotension.   Complaining of fatigue. Denies SOB.     Objective:   Weight Range:  Vital Signs:   Temp:  [97.7 F (36.5 C)-99.1 F (37.3 C)] 98.2 F (36.8 C) (09/30 0600) Pulse Rate:  [63-97] 63 (09/29 2007) Resp:  [15-28] 15 (09/30 0600) BP: (74-122)/(29-108) 101/50 (09/30 0600) SpO2:  [85 %-94 %] 94 % (09/30 0400) FiO2 (%):  [40 %] 40 % (09/30 0100) Weight:  [132.6 kg] 132.6 kg (09/30 0155) Last BM Date: 10/03/18  Weight change: Filed Weights   10/04/18 0237 10/05/18 0000 10/05/18 0155  Weight: 131.4 kg 132.6 kg 132.6 kg    Intake/Output:   Intake/Output Summary (Last 24 hours) at 10/05/2018 0720 Last data filed at 10/05/2018 0600 Gross per 24 hour  Intake 318.49 ml  Output 725 ml  Net -406.51 ml    CVP 6-7  Physical Exam: General:  No resp difficulty HEENT: normal Neck: supple. no JVD. Carotids 2+ bilat; no bruits. No lymphadenopathy or thryomegaly appreciated.  RIJ  Cor: PMI nondisplaced. Regular rate & rhythm. No rubs, gallops. 2/6 TR  Prominent P2  or murmurs. Lungs: clear Abdomen: soft, nontender, nondistended. No hepatosplenomegaly. No bruits or masses. Good bowel sounds. Extremities: no cyanosis, clubbing, rash, edema Neuro: alert & orientedx3, cranial nerves grossly intact. moves all 4 extremities w/o difficulty. Affect pleasant   Telemetry: Sr 80-90s personally reviewed.    Labs: Basic Metabolic Panel: Recent Labs  Lab 09/30/18 2327  10/01/18 1629 10/02/18 1026 10/02/18 1714 10/04/18 0500 10/05/18 0414  NA 126*   < > 128* 132* 131* 133* 133*  K 4.0   < > 3.6 3.2* 3.6 3.3* 4.3  CL 76*   < > 83* 88* 87* 88* 94*  CO2 40*   < > 30 29 32 34* 32  GLUCOSE 118*   < > 113* 109* 143* 106* 105*  BUN 46*   < > 37* 24* 23 17 17   CREATININE 2.67*   < > 1.55* 0.94 0.83 0.61 0.64  CALCIUM 8.2*   < > 7.8* 8.0* 8.0* 8.0*  7.9*  MG 1.9  --   --   --   --   --  1.2*  PHOS 3.0  --   --   --   --   --   --    < > = values in this interval not displayed.    Liver Function Tests: Recent Labs  Lab 09/30/18 2327 10/02/18 1026  AST 21 16  ALT 19 16  ALKPHOS 48 36*  BILITOT 0.6 1.2  PROT 7.5 6.4*  ALBUMIN 2.3* 2.1*   No results for input(s): LIPASE, AMYLASE in the last 168 hours. No results for input(s): AMMONIA in the last 168 hours.  CBC: Recent Labs  Lab 09/30/18 2327 10/01/18 0237 10/02/18 0513 10/03/18 0519 10/04/18 0500 10/05/18 0414  WBC 9.3 8.8 12.8* 10.8* 10.1 10.4  NEUTROABS 5.0  --   --   --   --   --   HGB 11.7* 11.1* 10.7* 10.4* 10.0* 10.1*  HCT 35.8* 33.5* 32.2* 31.4* 30.0* 31.3*  MCV 85.4 84.4 84.3 84.9 84.7 86.9  PLT 408* 394 389 309 232 242    Cardiac Enzymes: No results for input(s): CKTOTAL, CKMB, CKMBINDEX, TROPONINI in the last 168 hours.  BNP: BNP (last 3 results) Recent Labs  09/30/18 2327  BNP 974.3*    ProBNP (last 3 results) No results for input(s): PROBNP in the last 8760 hours.    Other results:  Imaging: Dg Chest Port 1 View  Result Date: 10/04/2018 CLINICAL DATA:  Shortness of breath EXAM: PORTABLE CHEST 1 VIEW COMPARISON:  10/03/2018 FINDINGS: Cardiac shadow is stable. Swan-Ganz catheter is again noted and stable. Right jugular central line is noted in the superior right atrium. The overall inspiratory effort is again poor. Bibasilar atelectatic changes are noted left slightly greater than right. No sizable effusion is seen. IMPRESSION: Stable appearance of the chest from the prior exam. Electronically Signed   By: Alcide CleverMark  Lukens M.D.   On: 10/04/2018 08:26   Dg Chest Port 1 View  Result Date: 10/03/2018 CLINICAL DATA:  Shortness of breath EXAM: PORTABLE CHEST 1 VIEW COMPARISON:  10/02/2018 FINDINGS: Interval repositioning of a left internal jugular approach pulmonary arterial catheter with distal tip now terminating within the left pulmonary artery.  Right IJ central venous catheter terminates at the level of the superior cavoatrial junction. Heart size is stable. Lung volumes are low. Bibasilar opacities, left greater than right. No large pleural fluid collection. No pneumothorax. IMPRESSION: 1. Left IJ PA catheter now terminates within the left pulmonary artery. No pneumothorax. 2. Persistent bibasilar opacities. Electronically Signed   By: Duanne GuessNicholas  Plundo M.D.   On: 10/03/2018 11:35     Medications:     Scheduled Medications: . arformoterol  15 mcg Nebulization BID  . budesonide (PULMICORT) nebulizer solution  0.25 mg Nebulization BID  . chlorhexidine  15 mL Mouth Rinse BID  . Chlorhexidine Gluconate Cloth  6 each Topical Daily  . digoxin  0.125 mg Oral Daily  . heparin injection (subcutaneous)  5,000 Units Subcutaneous Q8H  . mouth rinse  15 mL Mouth Rinse q12n4p  . midodrine  10 mg Oral TID WC  . montelukast  10 mg Oral QHS  . pantoprazole  40 mg Oral Q1200  . sildenafil  20 mg Oral TID  . sodium chloride flush  10-40 mL Intracatheter Q12H  . spironolactone  12.5 mg Oral Daily    Infusions: . sodium chloride Stopped (10/04/18 1341)  . magnesium sulfate bolus IVPB    . norepinephrine (LEVOPHED) Adult infusion 1 mcg/min (10/05/18 0600)  . sodium chloride      PRN Medications: acetaminophen, albuterol, benzonatate, ondansetron (ZOFRAN) IV, sodium chloride flush, traMADol   Assessment/Plan:    1. Acute respiratory failure - suspect multifactorial due to undiagnosed/end-stage OSA/OHS and diastolic HF. That said I think chronic hypoxemia and end-stage OHS predominate (see PH discussion below) - ABG 7.46/56/46/83% + bicarb 40 on admit consistent with chronic CO2 retention - denies h/o tobacco use.  - VQ negative 9/26 - remains on 40% HFNC -> O2 wean per CCM - may need home bipap - aggressive pulmonary toilet with IS and flutter valve  2. Pulmonary hypertension with RV failure.  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  - Continue sildenafil 20 mg tid.  - Continue low-dose digoxin - will send Rheum serologies for completeness sake - these are negative   3. Hypotension/shock - likely due to volume depletion and RV failure.  - ? Possible amyloid - Should be able to stop norepi.  - Check CO-OX now.  -  Continue midodrine 10 mg three times a day.   4. Acute on chronic diastolic HF - management as above - consider PYP scan - Volume status stable. CVP 6-7. No diuretics for now.  - Continue low dose spiro. Renal function stable.   5. AKI - resolved with IVF & holding diuretics  6. Hyponatremia - due to HF and diuresis - Sodium up 133 -  7. Morbid obesity with severe deconditioning - desperately needs weight loss.  - PT/OT to see  8. Hypokalemia Stable today.   Length of Stay: 5   Amy Clegg NP-C  10/05/2018, 7:20 AM  Advanced Heart Failure Team Pager [phone] (M-F; 7a - 4p)  Please contact [facility] for night-coverage after hours (4p -7a ) and weekends on [email or web address]  Patient seen and examined with the above-signed Advanced Practice Provider and/or Housestaff. I personally reviewed laboratory data, imaging studies and relevant notes. I independently examined the patient and formulated the important aspects of the plan. I have edited the note to reflect any of my changes or salient points. I have personally discussed the plan with the patient and/or family.  Remains on high flow O2 but sats in mid to high 90s. We started sildenafil yesterday but BP dropped and had to be placed on NE overnight (on top of midodrine). Will stop sildenafil. Weight stable. Volume status ok. Continue spiro. Would not add lasix yet (was on HCTZ at home and not lasix)   I suspect OHS/OSA and weakness are major issues here  with smaller component of diastolic HF. Needs SNF and aggressive rehab. Suspect RV will improve with appropriate oxygenation and treatment of OHS and possible OSA. WEight loss is essential.   Replace mag.   Arvilla Meres, MD  8:47 AM

## 2018-10-05 NOTE — Progress Notes (Signed)
eLink Physician-Brief Progress Note Patient Name: Autumn Brady DOB: Aug 07, 1940 MRN: [id number]   Date of Service  10/05/2018  HPI/Events of Note  Mg++ 1.2  eICU Interventions  6 gm MgS04  Iv per protocol        Kerry Kass Joquan Lotz 10/05/2018, 6:06 AM

## 2018-10-05 NOTE — Progress Notes (Signed)
OT Cancellation Note  Patient Details Name: Armya Westerhoff MRN: [id number] DOB: 1940-09-06   Cancelled Treatment:    Reason Eval/Treat Not Completed: OT screened, no needs identified, will sign off(Pt is functioning at her baseline.)  Malka So 10/05/2018, 8:31 AM  Nestor Lewandowsky, OTR/L Acute Rehabilitation Services Pager: [phone] Office: [phone]

## 2018-10-05 NOTE — Care Management (Signed)
CM consult for NIV as outpatient acknowledged. Patient is from [facility]; the [facility] will have to arrange DME from their facility for Medicare to cover.   Midge Minium RN, BSN, NCM-BC, ACM-RN [phone]

## 2018-10-05 NOTE — Progress Notes (Signed)
NAME:  Autumn Brady, MRN:  [id number], DOB:  November 24, 1940, LOS: 5 ADMISSION DATE:  09/30/2018, CONSULTATION DATE:  09/30/2018 REFERRING MD:  [facility], CHIEF COMPLAINT:  Hypoxic respiratory failure  Brief History   82 yof transferred from [facility], originally admitted for hypoxic respiratory failure being treated there for HCAP and heart failure with progressive AKI with diuresis and hypoxia requiring transferred to [facility] higher level of care.   Past Medical History  Respiratory failure, HTN, syncope, bradycardia, ?HF  Significant Hospital Events   9/16 tx to [facility] from [facility] 9/25 Admit to [facility]; given IVFs for presumed post-diuresis hypotension, qtc prolonged so zofran and levaquin stopped. started on IV heparin for elevated DDimer and high risk for PE (cr prevented assessment via CT-A) 9/26 getting ECHO/VQ and LE dopplers. Na dropping w/ balanced crystalloid resuscitation. 9/27: Sodium and creatinine improving.  Still on high flow oxygen but x-ray perhaps a little better physically looks better.  Still requiring nonrebreather.  PA catheter placed showing low normal filling pressures diuresis held off continuing norepinephrine heart failure keep close welted 9/28 still on high flow oxygen weaning norepinephrine demonstrating significant pulmonary hypertension 9/30 Restarted on low-dose levo overnight for hypotension. DC'd pressor this morning and stopped sildenafil  Consults:  Cardiology  Procedures:  Right IJ CVL 9/26>>> Left IJ Swan-Ganz catheter 9/27 Significant Diagnostic Tests:  Echocardiogram 9/26: Hyperdynamic left ventricle with EF 70 to 75% and severe LV septal wall thickness.  Severe LVH.  LV diastolic filling dysfunction noted.  Global RV systolic function normal however the RV was severely enlarged.  RV severely dilated consistent with RV overload.  Estimated pulmonary artery systolic pressure of 81 mmHg, assuming a right atrial pressure 15 mmHg Renal ultrasound 9/26: Negative  Nuclear medicine perfusion study 9/26-negative  Micro Data:  OSH COVID 9/17 >> neg  9/26 SARS CoV 2 9/26 BCx2 >>NG 9/26 UC >>NG  Antimicrobials:  None  Interim history/subjective:  Good BP this morning on Levophed 1 mcg/min  Objective    FiO2 (%):  [40 %] 40 % Blood pressure (!) 101/50, pulse 63, temperature 98.2 F (36.8 C), resp. rate 15, height 5\' 6"  (1.676 m), weight 132.6 kg, SpO2 94 %. CVP:  [6 mmHg-12 mmHg] 10 mmHg  FiO2 (%):  [40 %] 40 %   Intake/Output Summary (Last 24 hours) at 10/05/2018 0738 Last data filed at 10/05/2018 0600 Gross per 24 hour  Intake 318.49 ml  Output 725 ml  Net -406.51 ml   Filed Weights   10/04/18 0237 10/05/18 0000 10/05/18 0155  Weight: 131.4 kg 132.6 kg 132.6 kg   Physical Exam: General: Obese, chronically ill-appearing, no acute distress HENT: [place], AT, OP clear, MMM Eyes: EOMI, no scleral icterus Respiratory: Clear to auscultation bilaterally.  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 Wean HFNC for goal SpO2 >90% --Appreciate HF team input --Incentive spirometry, PT/OT --Continue nebulizer therapy (Brovana/Pulmicort)  Hypotension: suspect medication induced --DC levophed this morning. MAP goal >65 --Discussed with HF team. 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 Suspect dHF and undiagnosed OSA/OHS contributing. Autoimmune panel +ANA otherwise unrevealing. V/Q scan  neg. --Appreciate HF input. Continue digoxin  --Start nightly BiPAP nightly. Will refer to case management to obtain NIV as an outpatient --Will need formal sleep study as outpatient Patient's chronic respiratory failure due to [facility] secondary to suspected OSA is life threatening.  Previous ABG's have documented high PCO2. Patient would benefit from non-invasive ventilation.  Without this therapy, the patient is at high risk of ending up with worsening symptoms, worsened respiratory failure, need for ER visits and/or recurrent hospitalizations.    Acute on diastolic heart failure: Currently euvolemic --On spironolactone  Best practice:  Diet: Cardiac Pain/Anxiety/Delirium protocol (if indicated): n/a VAP protocol (if indicated): n/a DVT prophylaxis: heparin SQ GI prophylaxis: PPI Glucose control: CBG q 4, add SSI if > 180 Mobility: progress as able Code Status: full  Family Communication: Updated patient today on 9/30 Disposition: Transfer to floor tomorrow if stable and remains off pressors  The patient requires high complexity decision making for assessment and support, frequent evaluation and titration of therapies, application of advanced monitoring technologies and extensive interpretation of multiple databases.   Critical Care Time devoted to patient care services described in this note is 32 Minutes.   Rodman Pickle, M.D. [facility] Pulmonary/Critical Care Medicine 10/05/2018 7:38 AM  Pager: [phone] After hours pager: [phone]

## 2018-10-05 NOTE — Progress Notes (Signed)
eLink Physician-Brief Progress Note Patient Name: Terri Rorrer DOB: 12-25-1940 MRN: [id number]   Date of Service  10/05/2018  HPI/Events of Note  Charge RN on @H  requesting signing of Discharge Summary to facilitate previously scheduled LTAC transfer this evening.  eICU Interventions  Discharge Summary reviewed and signed as per request.     Intervention Category Minor Interventions: Communication with other healthcare providers and/or family  Frederik Pear 10/05/2018, 8:04 PM

## 2018-10-05 NOTE — TOC Initial Note (Addendum)
Transition of Care [facility]) - Initial/Assessment Note    Patient Details  Name: Autumn Brady MRN: [id number] Date of Birth: 28-Sep-1940  Transition of Care [facility]) CM/SW Contact:    Midge Minium RN, BSN, NCM-BC, ACM-RN [phone] Phone Number: 10/05/2018, 3:20 PM  Clinical Narrative:                 CM following for dispositional needs. CM met with patient with CM role explained. Patient states being a [facility] PTA, but is adamant on not returning, stating, "I just didn't like the treatment." CM discussed LTACH options, with patient agreeable to a referral being discussed with [facility] liaison. PT/OT recommending SNF; may have to explore SNF options if denied [facility]. Patient will need a lower O2 requirement for SNF placement. CM team will continue to follow.   Addendum: 10/05/18 @ 1608-Zale Marcotte RNCM-CM spoke to [employer]); able to accept the referral if the patient can transition to the facility later today. CM updated Dr. Loanne Drilling on bed availability. Facility will need a DC summary prior to transfer. The [facility] liaison will contact the patient to obtain verbal consent.   Expected Discharge Plan: Long Term Acute Care (LTAC) Barriers to Discharge: Continued Medical Work up   Patient Goals and CMS Choice Patient states their goals for this hospitalization and ongoing recovery are:: "to not go back to [facility]" [employer].gov Compare Post Acute Care list provided to:: Patient Choice offered to / list presented to : Patient  Expected Discharge Plan and Services Expected Discharge Plan: Long Term Acute Care (LTAC) In-house Referral: NA Discharge Planning Services: CM Consult Post Acute Care Choice: Long Term Acute Care (LTAC)([facility]) Living arrangements for the past 2 months: Post-Acute Facility                 DME Arranged: N/A DME Agency: NA       HH Arranged: NA HH Agency: NA        Prior Living Arrangements/Services Living arrangements for  the past 2 months: [place]   Patient language and need for interpreter reviewed:: Yes Do you feel safe going back to the place where you live?: No   Patient reports not wanting to return back to [place]  Need for Family Participation in Patient Care: Yes (Comment) Care giver support system in place?: Yes (comment)   Criminal Activity/Legal Involvement Pertinent to Current Situation/Hospitalization: No - Comment as needed  Activities of Daily Living Home Assistive Devices/Equipment: Shower chair with back, Bedside commode/3-in-1, Eyeglasses, Hand-held shower hose, Blood pressure cuff ADL Screening (condition at time of admission) Patient's cognitive ability adequate to safely complete daily activities?: Yes Is the patient deaf or have difficulty hearing?: No Does the patient have difficulty seeing, even when wearing glasses/contacts?: No Does the patient have difficulty concentrating, remembering, or making decisions?: No Patient able to express need for assistance with ADLs?: Yes Does the patient have difficulty dressing or bathing?: Yes Independently performs ADLs?: No Communication: Independent Dressing (OT): Dependent Is this a change from baseline?: Change from baseline, expected to last >3 days Grooming: Needs assistance Is this a change from baseline?: Change from baseline, expected to last >3 days Feeding: Independent Bathing: Dependent Is this a change from baseline?: Change from baseline, expected to last >3 days Toileting: Dependent Is this a change from baseline?: Change from baseline, expected to last >3days In/Out Bed: Dependent Is this a change from baseline?: Change from baseline, expected to last >3 days Walks in Home:  Independent with device (comment)(Rollator) Does the patient have difficulty walking or climbing stairs?: Yes Weakness of Legs: Both Weakness of Arms/Hands: None  Permission Sought/Granted Permission sought to share information  with : Case Manager, Chartered certified accountant granted to share information with : Yes, Verbal Permission Granted     Permission granted to share info w AGENCY: [facility]        Emotional Assessment Appearance:: Appears stated age Attitude/Demeanor/Rapport: Gracious Affect (typically observed): Pleasant, Accepting Orientation: : Oriented to Self, Oriented to Place, Oriented to  Time, Oriented to Situation Alcohol / Substance Use: Not Applicable Psych Involvement: No (comment)  Admission diagnosis:  [place] Patient Active Problem List   Diagnosis Date Noted  . (HFpEF) heart failure with preserved ejection fraction ([place]) 10/04/2018  . Pressure injury of skin 10/01/2018  . Hypotension due to hypovolemia   . Acute respiratory failure ([place])   . AKI (acute kidney injury) ([place])   . Hypoxia 09/30/2018  . Hyponatremia   . Right bundle branch block   . Hypokalemia   . Near syncope 07/05/2015  . Pre-syncope 07/05/2015   PCP:  Andres Shad, MD Pharmacy:   [facility] [place], [address] AT [place] [address] Phone: [phone] Fax: [phone]     Social Determinants of Health (SDOH) Interventions    Readmission Risk Interventions No flowsheet data found.

## 2018-10-06 ENCOUNTER — Inpatient Hospital Stay
Admission: AD | Admit: 2018-10-06 | Discharge: 2018-10-26 | Disposition: A | Discharge status: Skilled Nursing Facility | Payer: Medicare Other | Source: Ambulatory Visit | Attending: Internal Medicine | Admitting: Internal Medicine

## 2018-10-06 DIAGNOSIS — J189 Pneumonia, unspecified organism: Secondary | ICD-10-CM

## 2018-10-06 DIAGNOSIS — J9690 Respiratory failure, unspecified, unspecified whether with hypoxia or hypercapnia: Secondary | ICD-10-CM

## 2018-10-06 DIAGNOSIS — R110 Nausea: Secondary | ICD-10-CM

## 2018-10-06 LAB — CBC
HCT: 34.1 % — ABNORMAL LOW (ref 36.0–46.0)
Hemoglobin: 11.3 g/dL — ABNORMAL LOW (ref 12.0–15.0)
MCH: 28.5 pg (ref 26.0–34.0)
MCHC: 33.1 g/dL (ref 30.0–36.0)
MCV: 86.1 fL (ref 80.0–100.0)
Platelets: 223 10*3/uL (ref 150–400)
RBC: 3.96 MIL/uL (ref 3.87–5.11)
RDW: 15.1 % (ref 11.5–15.5)
WBC: 10.2 10*3/uL (ref 4.0–10.5)
nRBC: 0.3 % — ABNORMAL HIGH (ref 0.0–0.2)

## 2018-10-06 LAB — COMPREHENSIVE METABOLIC PANEL
ALT: 14 U/L (ref 0–44)
AST: 16 U/L (ref 15–41)
Albumin: 2.5 g/dL — ABNORMAL LOW (ref 3.5–5.0)
Alkaline Phosphatase: 47 U/L (ref 38–126)
Anion gap: 13 (ref 5–15)
BUN: 14 mg/dL (ref 8–23)
CO2: 29 mmol/L (ref 22–32)
Calcium: 8.5 mg/dL — ABNORMAL LOW (ref 8.9–10.3)
Chloride: 91 mmol/L — ABNORMAL LOW (ref 98–111)
Creatinine, Ser: 0.68 mg/dL (ref 0.44–1.00)
GFR calc Af Amer: >60 mL/min (ref >60)
GFR calc non Af Amer: >60 mL/min (ref >60)
Glucose, Bld: 85 mg/dL (ref 70–99)
Potassium: 4.7 mmol/L (ref 3.5–5.1)
Sodium: 133 mmol/L — ABNORMAL LOW (ref 135–145)
Total Bilirubin: 0.9 mg/dL (ref 0.3–1.2)
Total Protein: 6.9 g/dL (ref 6.5–8.1)

## 2018-10-06 LAB — CULTURE, BLOOD (ROUTINE X 2)
Culture: NO GROWTH
Culture: NO GROWTH
Special Requests: ADEQUATE

## 2018-10-06 LAB — BRAIN NATRIURETIC PEPTIDE: B Natriuretic Peptide: 958.1 pg/mL — ABNORMAL HIGH (ref 0.0–100.0)

## 2018-10-06 LAB — PROTIME-INR
INR: 1.1 (ref 0.8–1.2)
Prothrombin Time: 14.4 seconds (ref 11.4–15.2)

## 2018-10-06 LAB — DIGOXIN LEVEL: Digoxin Level: 0.4 ng/mL — ABNORMAL LOW (ref 0.8–2.0)

## 2018-10-06 LAB — TSH: TSH: 1.485 u[IU]/mL (ref 0.350–4.500)

## 2018-10-07 LAB — POCT I-STAT 7, (LYTES, BLD GAS, ICA,H+H)
Acid-Base Excess: 14 mmol/L — ABNORMAL HIGH (ref 0.0–2.0)
Bicarbonate: 39.7 mmol/L — ABNORMAL HIGH (ref 20.0–28.0)
Calcium, Ion: 1.04 mmol/L — ABNORMAL LOW (ref 1.15–1.40)
HCT: 34 % — ABNORMAL LOW (ref 36.0–46.0)
Hemoglobin: 11.6 g/dL — ABNORMAL LOW (ref 12.0–15.0)
O2 Saturation: 83 %
Patient temperature: 98.6
Potassium: 3.8 mmol/L (ref 3.5–5.1)
Sodium: 123 mmol/L — ABNORMAL LOW (ref 135–145)
TCO2: 41 mmol/L — ABNORMAL HIGH (ref 22–32)
pCO2 arterial: 55.6 mmHg — ABNORMAL HIGH (ref 32.0–48.0)
pH, Arterial: 7.462 — ABNORMAL HIGH (ref 7.350–7.450)
pO2, Arterial: 46 mmHg — ABNORMAL LOW (ref 83.0–108.0)

## 2018-10-08 LAB — MAGNESIUM: Magnesium: 1.6 mg/dL — ABNORMAL LOW (ref 1.7–2.4)

## 2018-10-09 LAB — MAGNESIUM: Magnesium: 1.7 mg/dL (ref 1.7–2.4)

## 2018-10-10 LAB — MAGNESIUM: Magnesium: 2 mg/dL (ref 1.7–2.4)

## 2018-10-14 ENCOUNTER — Other Ambulatory Visit (HOSPITAL_COMMUNITY): Payer: Medicare Other

## 2018-10-14 LAB — C-REACTIVE PROTEIN: CRP: 2.5 mg/dL — ABNORMAL HIGH (ref <1.0)

## 2018-10-14 LAB — SEDIMENTATION RATE: Sed Rate: 65 mm/hr — ABNORMAL HIGH (ref 0–22)

## 2018-10-14 LAB — URIC ACID: Uric Acid, Serum: 5 mg/dL (ref 2.5–7.1)

## 2018-10-15 ENCOUNTER — Other Ambulatory Visit (HOSPITAL_COMMUNITY): Payer: Medicare Other

## 2018-10-15 LAB — CBC
HCT: 30.4 % — ABNORMAL LOW (ref 36.0–46.0)
Hemoglobin: 10.1 g/dL — ABNORMAL LOW (ref 12.0–15.0)
MCH: 29 pg (ref 26.0–34.0)
MCHC: 33.2 g/dL (ref 30.0–36.0)
MCV: 87.4 fL (ref 80.0–100.0)
Platelets: 291 10*3/uL (ref 150–400)
RBC: 3.48 MIL/uL — ABNORMAL LOW (ref 3.87–5.11)
RDW: 16.1 % — ABNORMAL HIGH (ref 11.5–15.5)
WBC: 7.2 10*3/uL (ref 4.0–10.5)
nRBC: 0 % (ref 0.0–0.2)

## 2018-10-15 LAB — BASIC METABOLIC PANEL
Anion gap: 9 (ref 5–15)
BUN: 14 mg/dL (ref 8–23)
CO2: 31 mmol/L (ref 22–32)
Calcium: 8.4 mg/dL — ABNORMAL LOW (ref 8.9–10.3)
Chloride: 93 mmol/L — ABNORMAL LOW (ref 98–111)
Creatinine, Ser: 0.45 mg/dL (ref 0.44–1.00)
GFR calc Af Amer: >60 mL/min (ref >60)
GFR calc non Af Amer: >60 mL/min (ref >60)
Glucose, Bld: 106 mg/dL — ABNORMAL HIGH (ref 70–99)
Potassium: 4.6 mmol/L (ref 3.5–5.1)
Sodium: 133 mmol/L — ABNORMAL LOW (ref 135–145)

## 2018-10-20 ENCOUNTER — Other Ambulatory Visit (HOSPITAL_COMMUNITY): Payer: Medicare Other

## 2018-10-20 LAB — CBC
HCT: 34.2 % — ABNORMAL LOW (ref 36.0–46.0)
Hemoglobin: 11 g/dL — ABNORMAL LOW (ref 12.0–15.0)
MCH: 28.4 pg (ref 26.0–34.0)
MCHC: 32.2 g/dL (ref 30.0–36.0)
MCV: 88.4 fL (ref 80.0–100.0)
Platelets: 353 10*3/uL (ref 150–400)
RBC: 3.87 MIL/uL (ref 3.87–5.11)
RDW: 17.4 % — ABNORMAL HIGH (ref 11.5–15.5)
WBC: 9.4 10*3/uL (ref 4.0–10.5)
nRBC: 0 % (ref 0.0–0.2)

## 2018-10-21 ENCOUNTER — Other Ambulatory Visit (HOSPITAL_COMMUNITY): Payer: Medicare Other

## 2018-10-21 LAB — BASIC METABOLIC PANEL
Anion gap: 8 (ref 5–15)
BUN: 15 mg/dL (ref 8–23)
CO2: 29 mmol/L (ref 22–32)
Calcium: 8.5 mg/dL — ABNORMAL LOW (ref 8.9–10.3)
Chloride: 96 mmol/L — ABNORMAL LOW (ref 98–111)
Creatinine, Ser: 0.49 mg/dL (ref 0.44–1.00)
GFR calc Af Amer: >60 mL/min (ref >60)
GFR calc non Af Amer: >60 mL/min (ref >60)
Glucose, Bld: 85 mg/dL (ref 70–99)
Potassium: 3.9 mmol/L (ref 3.5–5.1)
Sodium: 133 mmol/L — ABNORMAL LOW (ref 135–145)

## 2018-10-24 LAB — CBC
HCT: 33 % — ABNORMAL LOW (ref 36.0–46.0)
Hemoglobin: 10.3 g/dL — ABNORMAL LOW (ref 12.0–15.0)
MCH: 27.8 pg (ref 26.0–34.0)
MCHC: 31.2 g/dL (ref 30.0–36.0)
MCV: 89.2 fL (ref 80.0–100.0)
Platelets: 343 10*3/uL (ref 150–400)
RBC: 3.7 MIL/uL — ABNORMAL LOW (ref 3.87–5.11)
RDW: 17.3 % — ABNORMAL HIGH (ref 11.5–15.5)
WBC: 6.8 10*3/uL (ref 4.0–10.5)
nRBC: 0 % (ref 0.0–0.2)

## 2018-10-24 LAB — BASIC METABOLIC PANEL
Anion gap: 6 (ref 5–15)
BUN: 14 mg/dL (ref 8–23)
CO2: 30 mmol/L (ref 22–32)
Calcium: 8.4 mg/dL — ABNORMAL LOW (ref 8.9–10.3)
Chloride: 97 mmol/L — ABNORMAL LOW (ref 98–111)
Creatinine, Ser: 0.42 mg/dL — ABNORMAL LOW (ref 0.44–1.00)
GFR calc Af Amer: >60 mL/min (ref >60)
GFR calc non Af Amer: >60 mL/min (ref >60)
Glucose, Bld: 111 mg/dL — ABNORMAL HIGH (ref 70–99)
Potassium: 4.5 mmol/L (ref 3.5–5.1)
Sodium: 133 mmol/L — ABNORMAL LOW (ref 135–145)

## 2018-10-25 LAB — SARS CORONAVIRUS 2 (TAT 6-24 HRS): SARS Coronavirus 2: NEGATIVE

## 2021-02-21 IMAGING — US US RENAL
1 series · 14 of 25 positions shown · non-contrast
Comparison: None.

CLINICAL DATA: 78-year-old female with acute kidney injury

EXAM:
RENAL / URINARY TRACT ULTRASOUND COMPLETE

[Series 1: us renal · 14 of 36 slices shown]
[im 1/36]
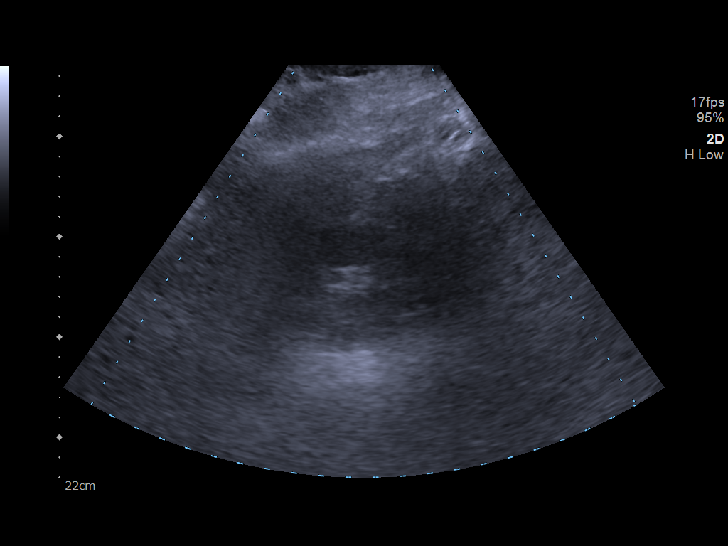
[im 3/36]
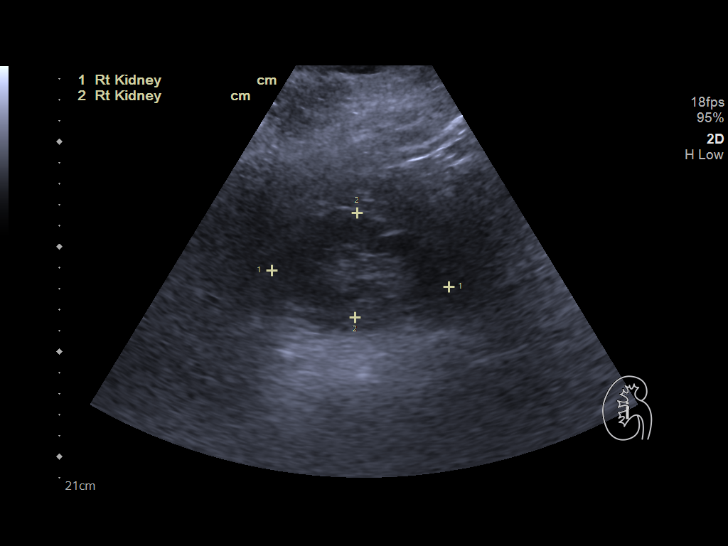
[im 6/36]
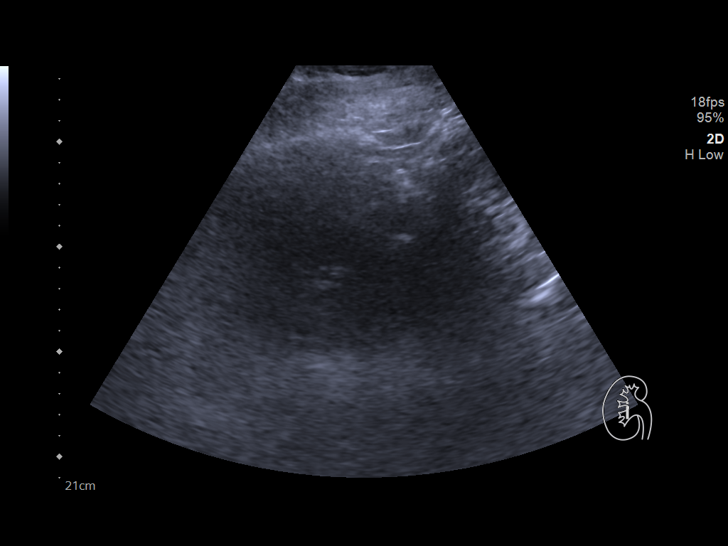
[im 9/36]
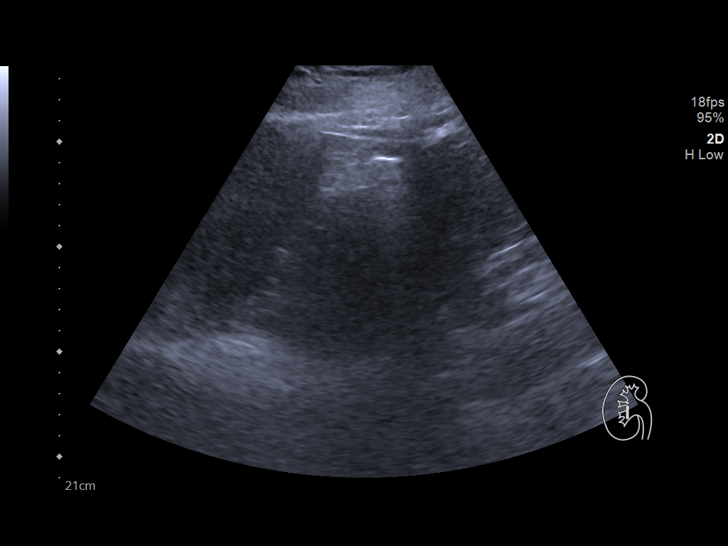
[im 12/36]
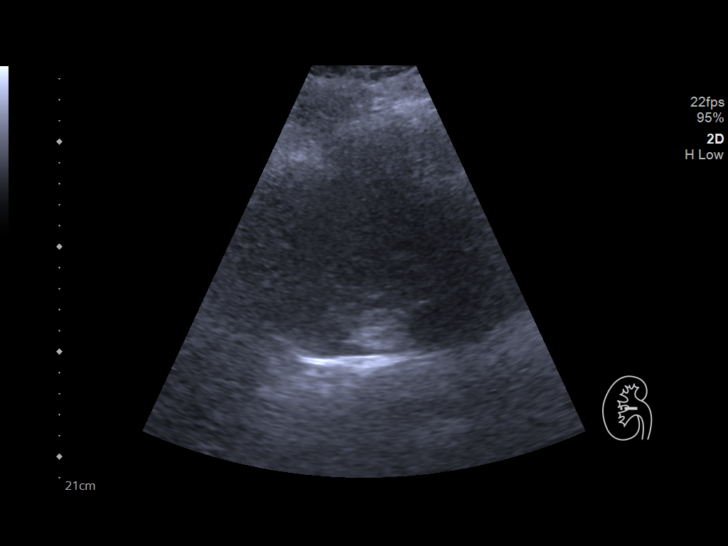
[im 14/36]
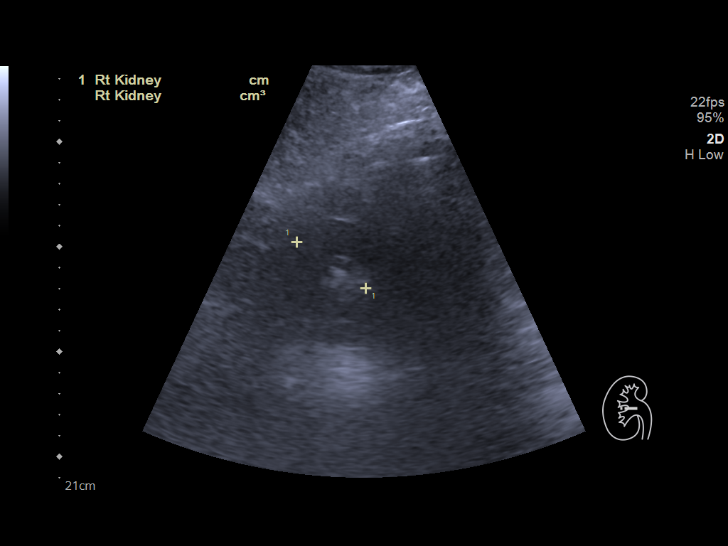
[im 17/36]
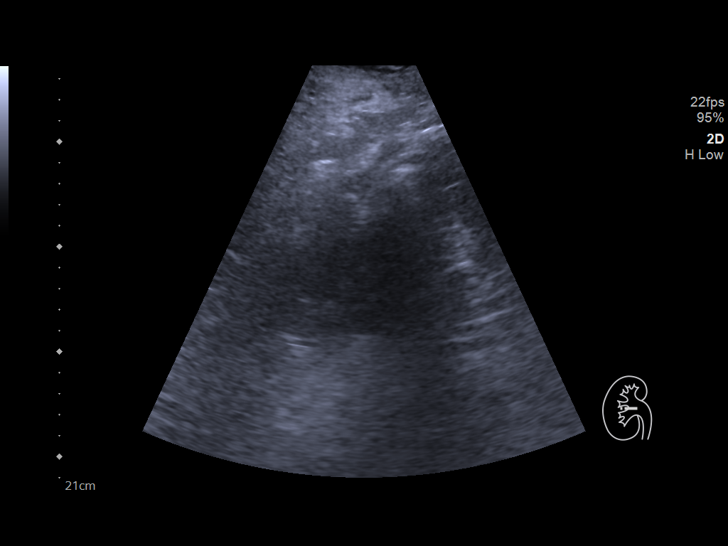
[im 19/36]
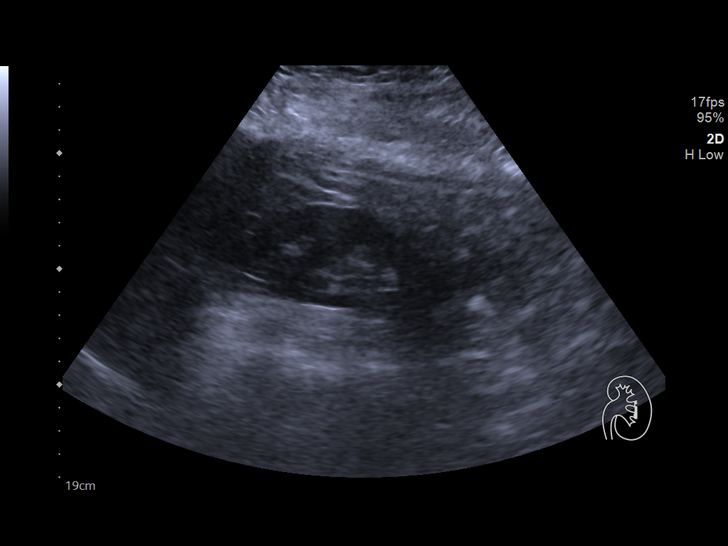
[im 22/36]
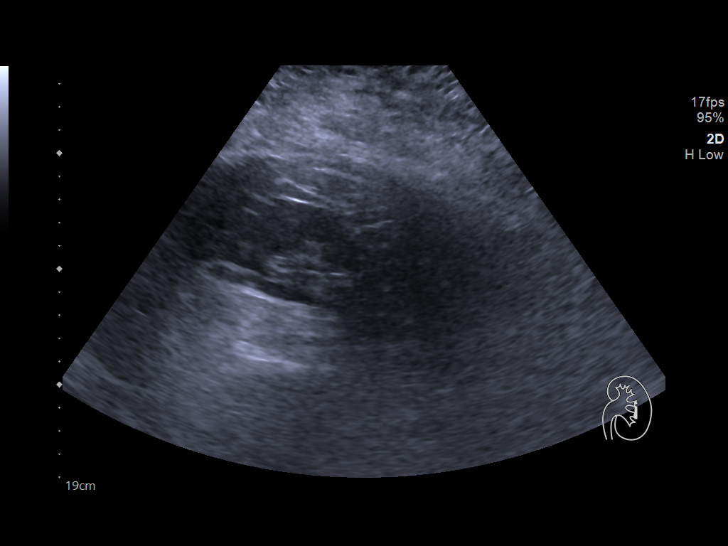
[im 24/36]
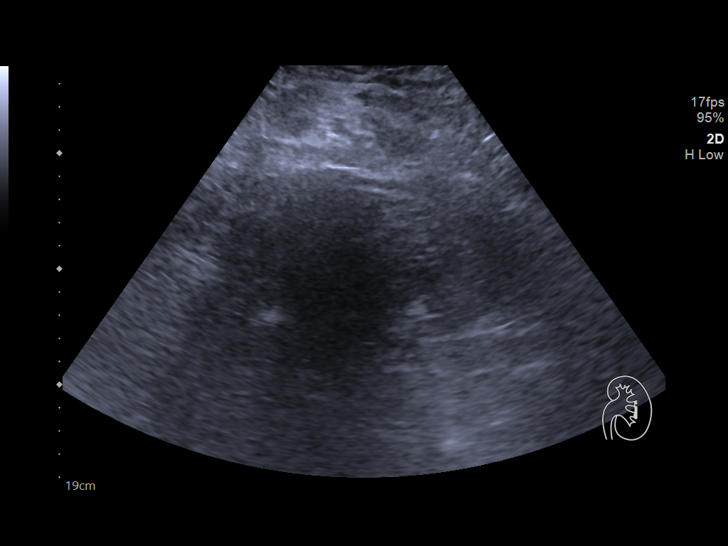
[im 27/36]
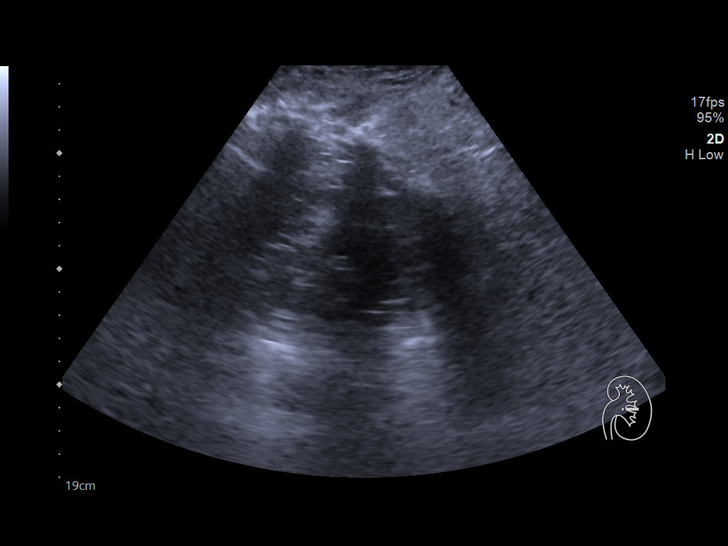
[im 30/36]
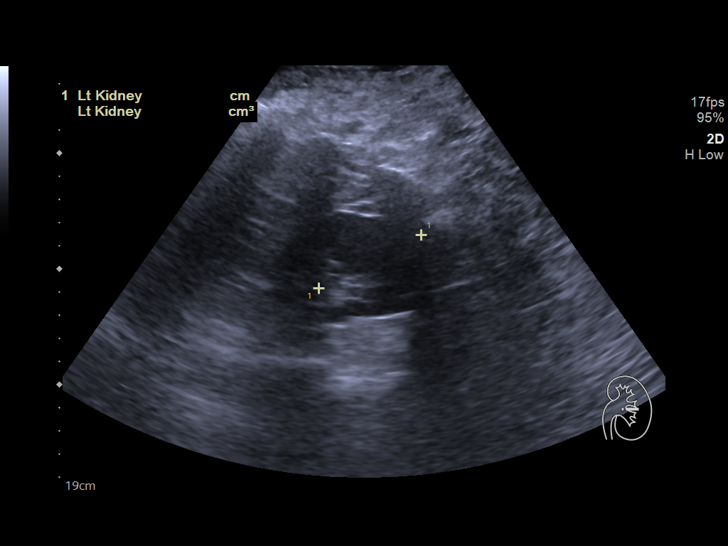
[im 33/36]
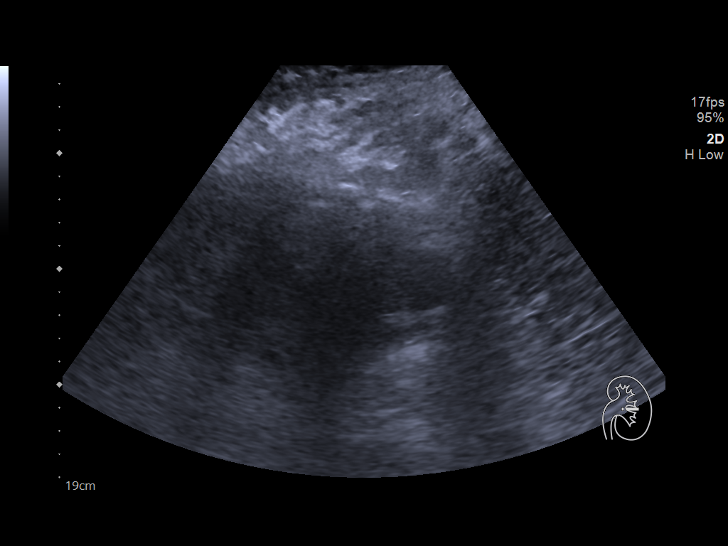
[im 36/36]
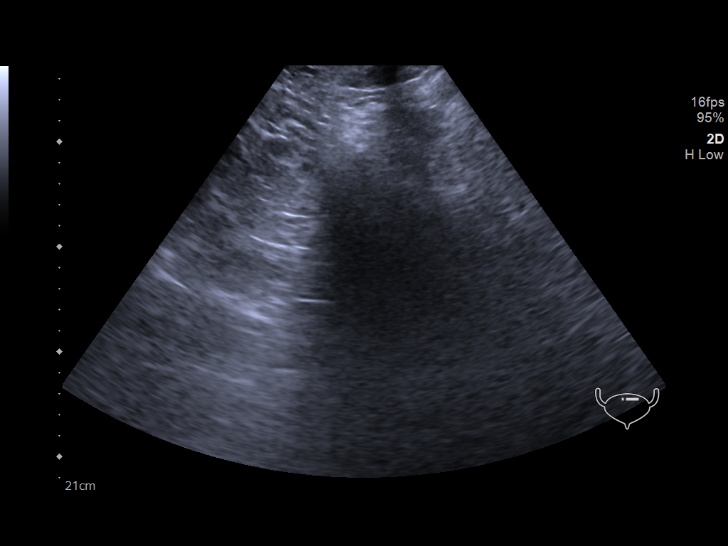

[14 of 25 positions shown; findings below may reference images not displayed]

FINDINGS: Right Kidney:

Length: 8.5 cm x 5.0 cm x 4.0 cm, 87 cc. No hydronephrosis.
Unremarkable echogenicity. Flow confirmed in the hilum.

Left Kidney:

Length: 9.7 cm x 4.4 cm x 5.0 cm, 105 cc. No hydronephrosis.
Unremarkable echogenicity. Flow confirmed in the hilum of the left
kidney.

Bladder:

Foley catheter in position.
IMPRESSION: No evidence of hydronephrosis.

## 2021-02-23 IMAGING — DX DG CHEST 1V PORT
2 series · 2 of 2 positions shown · non-contrast
Comparison: 10/02/2018

CLINICAL DATA: Shortness of breath

EXAM:
PORTABLE CHEST 1 VIEW

[chest ap (1 of 2)]
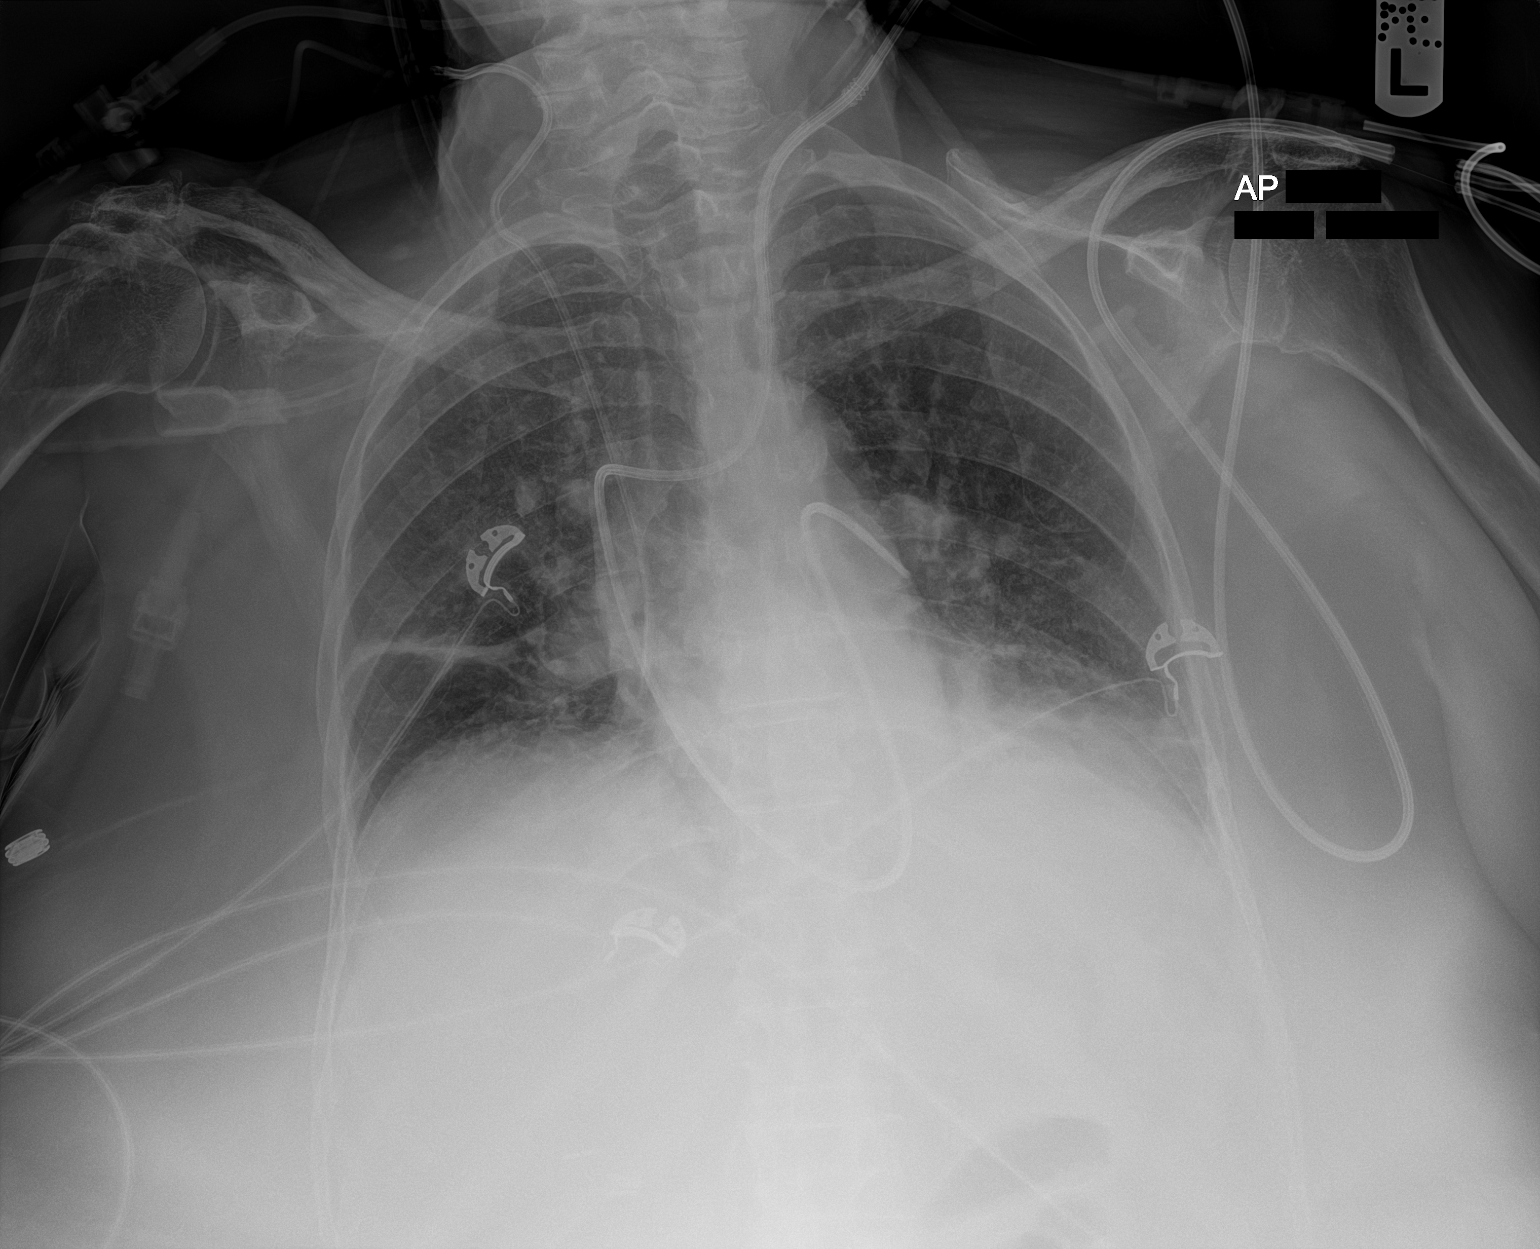

[chest ap (2 of 2)]
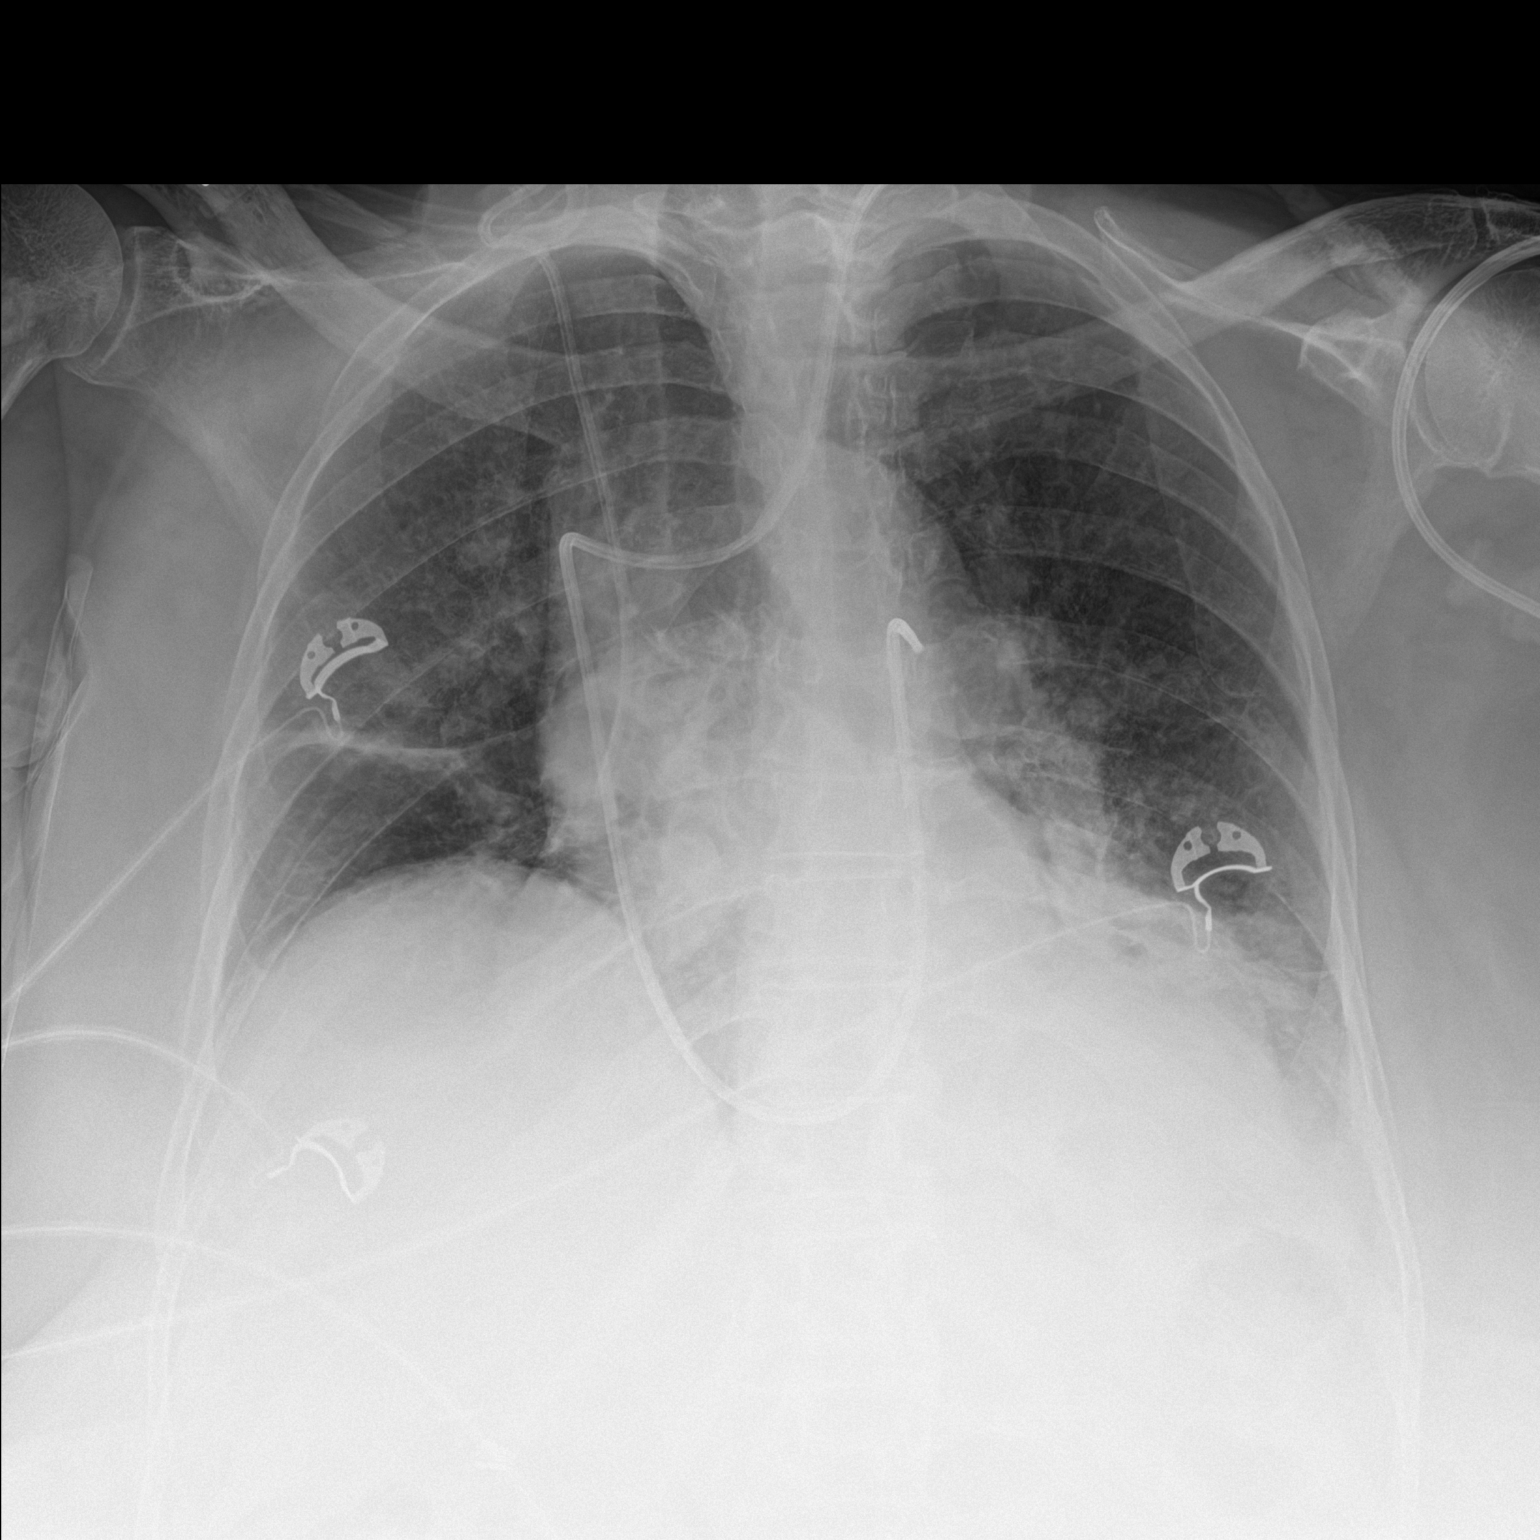

[2 of 2 positions shown; findings below may reference images not displayed]

FINDINGS: Interval repositioning of a left internal jugular approach pulmonary
arterial catheter with distal tip now terminating within the left
pulmonary artery. Right IJ central venous catheter terminates at the
level of the superior cavoatrial junction. Heart size is stable.
Lung volumes are low. Bibasilar opacities, left greater than right.
No large pleural fluid collection. No pneumothorax.
IMPRESSION: 1. Left IJ PA catheter now terminates within the left pulmonary
artery. No pneumothorax.
2. Persistent bibasilar opacities.
